# Patient Record
Sex: Female | Born: 1970 | Race: White | Hispanic: No | Marital: Married | Smoking: Never smoker
Health system: Southern US, Community
[De-identification: ages and names within clinical notes are randomized; demographics above are authoritative.]

## PROBLEM LIST (undated history)

## (undated) DIAGNOSIS — E079 Disorder of thyroid, unspecified: Secondary | ICD-10-CM

## (undated) DIAGNOSIS — F509 Eating disorder, unspecified: Secondary | ICD-10-CM

## (undated) DIAGNOSIS — F319 Bipolar disorder, unspecified: Secondary | ICD-10-CM

## (undated) HISTORY — PX: TYMPANOSTOMY TUBE PLACEMENT: SHX32

## (undated) HISTORY — PX: MULTIPLE TOOTH EXTRACTIONS: SHX2053

## (undated) HISTORY — PX: LEEP: SHX91

## (undated) HISTORY — DX: Bipolar disorder, unspecified: F31.9

## (undated) HISTORY — DX: Disorder of thyroid, unspecified: E07.9

## (undated) HISTORY — DX: Eating disorder, unspecified: F50.9

---

## 2000-08-20 ENCOUNTER — Other Ambulatory Visit: Admission: RE | Admit: 2000-08-20 | Discharge: 2000-08-20 | Payer: Self-pay | Admitting: Gynecology

## 2000-09-03 ENCOUNTER — Encounter: Admission: RE | Admit: 2000-09-03 | Discharge: 2000-09-03 | Payer: Self-pay | Admitting: Gastroenterology

## 2000-09-03 ENCOUNTER — Encounter: Payer: Self-pay | Admitting: Gastroenterology

## 2001-08-25 ENCOUNTER — Other Ambulatory Visit: Admission: RE | Admit: 2001-08-25 | Discharge: 2001-08-25 | Payer: Self-pay | Admitting: Internal Medicine

## 2002-07-10 ENCOUNTER — Encounter: Payer: Self-pay | Admitting: Family Medicine

## 2002-07-10 ENCOUNTER — Encounter: Admission: RE | Admit: 2002-07-10 | Discharge: 2002-07-10 | Payer: Self-pay | Admitting: Family Medicine

## 2003-04-21 ENCOUNTER — Other Ambulatory Visit: Admission: RE | Admit: 2003-04-21 | Discharge: 2003-04-21 | Payer: Self-pay | Admitting: Gynecology

## 2003-06-30 ENCOUNTER — Inpatient Hospital Stay (HOSPITAL_COMMUNITY): Admission: EM | Admit: 2003-06-30 | Discharge: 2003-07-05 | Payer: Self-pay | Admitting: Psychiatry

## 2005-01-09 ENCOUNTER — Other Ambulatory Visit: Admission: RE | Admit: 2005-01-09 | Discharge: 2005-01-09 | Payer: Self-pay | Admitting: Gynecology

## 2005-08-02 ENCOUNTER — Other Ambulatory Visit: Admission: RE | Admit: 2005-08-02 | Discharge: 2005-08-02 | Payer: Self-pay | Admitting: Gynecology

## 2005-08-09 ENCOUNTER — Ambulatory Visit (HOSPITAL_BASED_OUTPATIENT_CLINIC_OR_DEPARTMENT_OTHER): Admission: RE | Admit: 2005-08-09 | Discharge: 2005-08-09 | Payer: Self-pay | Admitting: Gynecology

## 2006-12-17 ENCOUNTER — Other Ambulatory Visit: Admission: RE | Admit: 2006-12-17 | Discharge: 2006-12-17 | Payer: Self-pay | Admitting: Gynecology

## 2007-04-18 ENCOUNTER — Ambulatory Visit: Payer: Self-pay | Admitting: Psychiatry

## 2007-04-18 ENCOUNTER — Inpatient Hospital Stay (HOSPITAL_COMMUNITY): Admission: AD | Admit: 2007-04-18 | Discharge: 2007-04-23 | Payer: Self-pay | Admitting: Psychiatry

## 2009-11-02 ENCOUNTER — Emergency Department (HOSPITAL_COMMUNITY): Admission: EM | Admit: 2009-11-02 | Discharge: 2009-11-02 | Payer: Self-pay | Admitting: Emergency Medicine

## 2011-04-10 NOTE — H&P (Signed)
[name], [name]                    ACCOUNT NO.:  [id number]   MEDICAL RECORD NO.:  [id number]          PATIENT TYPE:  IPS   LOCATION:  0602                          FACILITY:  [facility]   PHYSICIAN:  Anselm Jungling, MD  DATE OF BIRTH:  14-Oct-1971   DATE OF ADMISSION:  04/18/2007  DATE OF DISCHARGE:                       PSYCHIATRIC ADMISSION ASSESSMENT   IDENTIFYING INFORMATION:  This is a voluntary admission to the services  of Dr. Geralyn Flash.  This is a 40 year old divorced white female.  She reports that, over the past several months, she has been feeling  completely hopeless.  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  She states that she prays  to God every night to take her life.  The patient was referred by her  outpatient [facility] therapist, Gwen __________, due to weight loss and  sleep disturbance.  She has decreased her sleep to approximately four  hours at night and she has lost 10 pounds in the past two months.  She  states the meds are not working and her depression continues to  increase and she feels like she is crashing into a black hole.  She has  been on disability for her bipolar disorder for a number of years.  However, she is not making ends meet and has recently had to return to  work.   PAST PSYCHIATRIC HISTORY:  She was here with [place] at the [facility] June 30, 2003 to July 05, 2003.  She was initially hospitalized  at age 48 or 19 in 46, [place] for an eating disorder, anorexia, at  age 59 she was diagnosed as bipolar in [place] and she had two prior  hospitalizations in [place] again for the bipolar illness prior to her  admission here in 2004.   SOCIAL HISTORY:  She is divorced.  She has no children.  She has just  returned to work as a Scientist, physiological a few months ago and she does have a  boyfriend.   FAMILY HISTORY:  Her father and grandfather were bipolar.  Her paternal  grandmother had schizophrenia.   ALCOHOL/DRUG HISTORY:  She denies.   PRIMARY CARE PHYSICIAN:  Primary care Winnell Bento is Dr. Waynard Reeds.  She  sees Fabio Pierce, [address].D. for her psychiatric med management.   MEDICAL PROBLEMS:  None at this time.   CURRENT MEDICATIONS:  She is currently taking Geodon 80 mg at h.s.,  Prozac 80 mg at h.s., Topamax 100 mg at h.s. and trazodone 100 mg p.o.  at h.s.   ALLERGIES:  SULFA (she has an itch).   POSITIVE PHYSICAL FINDINGS:  She appears to be a well-developed, well-  nourished female who appears her stated age of 37.  She had no  remarkable physical findings.  Her vital signs show that she is 49  inches tall.  She weighs 91 pounds.  Her temperature is 98.1, her blood  pressure is 111/65, her pulse is 63 and her respirations are 20.   LABORATORY DATA:  Her labs are pending.  MENTAL STATUS EXAM:  Today, she is alert and oriented x3.  She is  appropriately groomed and dressed.  She does not appear to have an  unusual weight loss.  She is pleasant but is consistent with her height  and body frame.  Her mood and affect is pleasant but she is very sad.  Her facial expressions tend to be grim.  There was no evidence for  psychosis or thought disorder.  She denied suicidal ideation, plan or  intent.  Her intelligence is at least average.  Concentration and memory  are intact.  Judgment and insight are good.   DIAGNOSES:  AXIS I:  Bipolar, depressed.  History for eating disorder,  anorexia.  AXIS II:  Deferred.  AXIS III:  None at present.  AXIS IV:  Problems with primary support group and economic problems.  AXIS V:  30.   PLAN:  To increase the data and history.  To confer with Dr. Clare Gandy  regarding treatment strategy.  To explore for supports.  There will be a  family session scheduled with her mother and boyfriend.   ESTIMATED LENGTH OF STAY:  Three to five days.  Dr. Clare Gandy suggested  stopping the Topamax and starting Zyprexa.  She  states that the patient  tends to be very anxious and the patient has not had Zyprexa in the  past.  She stated she has had Seroquel with a bad response.  We will go  ahead and stop the Topamax and will start Zyprexa Zydis 10 mg at h.s.  tonight.      Mickie Leonarda Salon, P.A.-C.      Anselm Jungling, MD  Electronically Signed    MD/MEDQ  D:  04/19/2007  T:  04/19/2007  Job:  [id number]

## 2011-04-13 NOTE — H&P (Signed)
[name], [name]                              ACCOUNT NO.:  [id number]   MEDICAL RECORD NO.:  [id number]                   PATIENT TYPE:  IPS   LOCATION:  0507                                 FACILITY:  [facility]   PHYSICIAN:  Geoffery Lyons, M.D.                   DATE OF BIRTH:  1971/03/01   DATE OF ADMISSION:  06/30/2003  DATE OF DISCHARGE:  07/05/2003                         PSYCHIATRIC ADMISSION ASSESSMENT   IDENTIFYING INFORMATION:  This is a 40 year old married white female who is  a voluntary admission.   HISTORY OF PRESENT ILLNESS:  This patient, with a history of bipolar  disorder, had thoughts of cutting herself and started an attempt but did not  follow through on this on June 30, 2003.  She was frustrated and felt  rejected sexually by an acquaintance, who was not her husband.  The patient  and her husband are having sexual problems and the patient is quite  frustrated by their marital relationship, he being less interested or less  concerned about their sexual difficulties than she is.  She endorses having  suicidal thoughts yesterday.  It is unclear if she feels suicidal today.  She has had no clear homicidal thoughts or auditory or visual  hallucinations.  She was able to restrain herself yesterday.  Although she  went looking for a knife, she did not actually attempt to cut herself and  thought the better of it.   PAST PSYCHIATRIC HISTORY:  The patient is followed by Dr. Phyllis Ginger. [name] and  [name], who is her psychotherapist.  This is her [facility] admission to  [facility].  Her last hospitalization was in four  years ago in [place], [place] for suicidal thoughts.  The patient has said  that she has had suicidal ideation many times in the past, even through her  younger years with multiple hospitalizations.   SOCIAL HISTORY:  The patient has been married for seven days.  She has no  children.  No legal problems.  She lives at home with her  husband.  She is  not employed outside of the home.  The patient receives social security  disability income for her bipolar illness.   FAMILY HISTORY:  Father and an uncle with bipolar disorder.   ALCOHOL/DRUG HISTORY:  She denies.   MEDICAL HISTORY:  The patient is followed by Dr. Francene Finders, who is her  primary care physician.  Medical problems she denies.  Past medical history  is remarkable for removal of a cyst on her chin.  She has no history of  seizures, blackouts, memory loss.  No other surgeries or prior  hospitalizations.   MEDICATIONS:  The patient takes Tegretol 400 mg p.o. q.d., Wellbutrin 450 mg  p.o. q.d., Topamax 200 mg q.d., Minocycline 100 mg p.o. q.d., trazodone 150  mg p.o. q.h.s. and Klonopin 0.5 mg b.i.d.  She reports that she takes her  medications regularly.   ALLERGIES:  SULFA.   POSITIVE PHYSICAL FINDINGS:  This is a well-nourished, well-developed,  petite-built female who is generally healthy in appearance.  Vital signs on  admission were temperature 98.2, pulse 80, respirations 16, blood pressure  107/65.  She is 4 feet 11 inches tall and weighed 93 pounds.  She is fully  alert and cooperative.  HEAD:  Normocephalic and atraumatic.  EENT:  PERRLA.  Sclerae nonicteric.  No rhinorrhea.  Oropharynx noninjected.  NECK:  Supple.  CARDIOVASCULAR:  S1 and S2, no clicks, murmurs or gallops.  Apical pulse is  regular and synchronous with radial pulse.  LUNGS:  Clear to auscultation.  ABDOMEN:  Flat, soft, nontender.  GENITALIA:  Deferred.  MUSCULOSKELETAL:  No joint deformity noted.  Full range of motion.  Strength  5/5.  NEURO:  Cranial nerves 2-12 intact.  EOMs intact.  No nystagmus.  Motor  movements are smooth.  Sensory grossly intact.  Facial symmetry is present.  Grip strength equal bilaterally.  Deep tendon reflexes are 2+/5 and are  brisk.  Romberg without findings.  Toes are downgoing.  No focal findings.   LABORATORY DATA:  The patient's Tegretol  level is less than 2.0 in spite of  the fact that she says she takes her medication on a regular basis.  Other  labs reveal very mild decreased hemoglobin at 11.4, hematocrit 32.8,  platelets 219,000 and MCV normal at 95.  Potassium very slightly decreased  at 3.2.   MENTAL STATUS EXAM:  This is a fully alert female with a bright affect.  She  is well-composed, polite and cooperative.  Speech is normal.  Mood is  frustrated.  Mildly depressed but her thinking is logical and coherent.  Goal-oriented.  Some vague suicidal thoughts.  She has been at home with a  lot of time on her hands and has started playing with knives and feeling  very rejected, both by her husband and then finds herself rejected by  another man.  She is very concerned about not being sexually intimate with  her husband for the past six years.  Feels that he has rejected her and,  then when she attempts to go have a sexual liaison with another man, he also  rejects her.  No auditory or visual hallucinations.  No homicidal ideation.  Cognitively, she is intact and oriented x 3.   DIAGNOSES:   AXIS I:  Bipolar disorder not otherwise specified.   AXIS II:  Deferred/   AXIS III:  No diagnosis.   AXIS IV:  Moderate to severe (marital conflict).   AXIS V:  Current 40; past year 60.   PLAN:  Voluntarily admit the patient with 15-minute checks in place.  We  will recheck a Tegretol level on her and, at this point, continue her  current medications and see how she responds if this Tegretol level comes  back up to normal.  Otherwise, we will make plans from there.  Meanwhile,  address her mood stability and we will monitor her closely and we are  requesting a marital session as soon as possible.   ESTIMATED LENGTH OF STAY:  Five days.     Margaret A. Scott, N.P.                   Geoffery Lyons, M.D.    MAS/MEDQ  D:  08/17/2003  T:  08/18/2003  Job:  [id number]

## 2011-04-13 NOTE — Discharge Summary (Signed)
[name], [name]                              ACCOUNT NO.:  [id number]   MEDICAL RECORD NO.:  [id number]                   PATIENT TYPE:  IPS   LOCATION:  0507                                 FACILITY:  [facility]   PHYSICIAN:  Geoffery Lyons, M.D.                   DATE OF BIRTH:  11/21/1971   DATE OF ADMISSION:  06/30/2003  DATE OF DISCHARGE:  07/05/2003                                 DISCHARGE SUMMARY   CHIEF COMPLAINT AND PRESENT ILLNESS:  This was the first admission to [facility] for this 40 year old married white female voluntarily  admitted.  History of bipolar disorder, had thoughts of cutting herself.  Started an attempt but did not follow through.  Was frustrated and felt  rejected sexually by acquaintance, not the husband.  The patient and husband  have been having sexual problems.  The patient was frustrated with marital  relationship.  Endorsed suicidal ideation before she came in.   PAST PSYCHIATRIC HISTORY:  Actually seeing Mark P. Ilsa Iha, M.D. and Patty  ________ .  First time at [facility].   ALCOHOL/DRUG HISTORY:  Denies the use or abuse of any substances.   PAST MEDICAL HISTORY:  No history of any major medical condition.   MEDICATIONS:  Tegretol 400 mg daily, Wellbutrin 450 mg daily, Topamax 200 mg  daily, minocycline 100 mg every day, trazodone 150 mg at night, Klonopin 0.5  mg twice a day.   PHYSICAL EXAMINATION:  Performed and failed to show any acute findings.   MENTAL STATUS EXAM:  Fully alert and cooperative female.  Mood anxiety.  Affect broad.  Well-composed, polite, cooperative.  Speech normal rate,  rhythm and production.  Mood frustrated, mildly depressed.  Thoughts logical  and coherent.  Vague suicidal ideation.  Overwhelmed with what was going on.  Issues of self-esteem, self-image.  Cognition well-preserved.   ADMISSION DIAGNOSES:   AXIS I:  Bipolar disorder, depressed.   AXIS II:  No diagnosis.   AXIS III:  No  diagnosis.   AXIS IV:  Moderate,   AXIS V:  Global Assessment of Functioning upon admission 35; highest Global  Assessment of Functioning in the last year 68.   LABORATORY DATA:  CBC with hemoglobin 11.4.  Blood chemistry within normal  limits.  Thyroid profile within normal limits.   HOSPITAL COURSE:  She was admitted and started intensive individual and  group psychotherapy.  She was basically kept on her medications, Tegretol  400 mg daily, Wellbutrin XL 300 mg in the morning, Topamax 200 mg daily,  Prozac 40 mg daily, trazodone 150 mg at night, Klonopin 0.5 mg twice a day  and minocycline 100 mg daily.  Wellbutrin was increased to the 450 mg she  was taking before.  Did endorse that she was feeling guilty and ashamed  because she had the affair.  The person  broke off the relationship with her.  Felt very suicidal.  In the unit, she was able to open up and deal with the  grief and the loss.  Started sleeping better.  Found out that the husband  accepted a new job and they might have to move.  Became very upset.  Worked  on the relationship with the husband.  Started feeling better.  On July 05, 2003, she was in full contact with reality.  Mood improved.  Affect bright,  broad.  No suicidal ideation.  No homicidal ideation.  No delusions.  No  hallucinations.  Increased insight in terms of the stressors that led to the  admission.  Willing to pursue outpatient treatment and deal with these  events.   DISCHARGE DIAGNOSES:   AXIS I:  Bipolar disorder, depressed.   AXIS II:  No diagnosis.   AXIS III:  No diagnosis.   AXIS IV:  Moderate.   AXIS V:  Global Assessment of Functioning upon discharge 55-60.   DISCHARGE MEDICATIONS:  1. Minocycline 100 mg daily.  2. Wellbutrin XL 450 mg in the morning.  3. Tegretol 200 mg, 2 at bedtime.  4. Klonopin 0.5 mg at bedtime.  5. Topamax 100 mg twice a day.  6. Prozac 20 mg twice a day.  7. Trazodone 150 mg at bedtime.   FOLLOWUP:   Patti __________ and Phyllis Ginger. Ilsa Iha, M.D.                                               Geoffery Lyons, M.D.    IL/MEDQ  D:  07/28/2003  T:  07/29/2003  Job:  [id number]

## 2011-04-13 NOTE — Op Note (Signed)
NAMEMarkeia, [name]                    ACCOUNT NO.:  [id number]   MEDICAL RECORD NO.:  [id number]          PATIENT TYPE:  AMB   LOCATION:  NESC                         FACILITY:  [facility]   PHYSICIAN:  Juan H. Lily Peer, M.D.DATE OF BIRTH:  15-Feb-1971   DATE OF PROCEDURE:  08/09/2005  DATE OF DISCHARGE:                                 OPERATIVE REPORT   SURGEON:  Juan H. 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   PREOPERATIVE DIAGNOSES:  1.  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  3.  Negative ECC.   POSTOPERATIVE DIAGNOSES:  1.  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  3.  Negative ECC.   ANESTHESIA:  General endotracheal anesthesia.   FINDINGS:  Extensive cobblestoning effect from the 9 to the 3 o'clock  position and leukoplakic lesions from the 3 to 9 o'clock position.  The  ectocervix transformation zone was visualized.   DESCRIPTION OF OPERATION:  After the patient was adequately counseled, she  was taken to the operating room where she underwent general tracheal  anesthesia.  She was placed in high lithotomy position.  The vaginal and  perineal areas were draped and the surrounding area with moist laps.  A  titanium coated speculum was inserted into the vaginal vault with a plume  extractor attached to it.  Acetic acid was applied to the cervix and the  vagina, reinspection of the entire vagina and cervix.  The cervical findings  as described above.  With a CO2 laser brought into view and set at 8 watts  on a continuous mode, the area was delineated in a circumferential fashion,  alignment at least 2 mm away from the lesion and then in a brush-like  technique, the ectocervix was ablated including the transformation  zone in  the entire ectocervix for a depth of approximately 3-4 mm.  Monsel solution  was used for hemostasis, and then Amino-Cerv cream was applied  intravaginally which the patient will continue to apply at home q.h.s. for  the next 2 weeks.  She was extubated, transferred to recovery room with  stable vital signs.  Blood loss was minimal.  Fluid resuscitation consisted  of 1000 mL of lactated Ringer's.      Juan H. Lily Peer, M.D.  Electronically Signed    JHF/MEDQ  D:  08/09/2005  T:  08/09/2005  Job:  [id number]

## 2011-04-13 NOTE — Discharge Summary (Signed)
[name], Sanchez                    ACCOUNT NO.:  [id number]   MEDICAL RECORD NO.:  [id number]          PATIENT TYPE:  IPS   LOCATION:  0602                          FACILITY:  [facility]   PHYSICIAN:  Anselm Jungling, MD  DATE OF BIRTH:  September 05, 1971   DATE OF ADMISSION:  04/18/2007  DATE OF DISCHARGE:  04/23/2007                               DISCHARGE SUMMARY   IDENTIFYING DATA AND REASON FOR ADMISSION:  The patient is a 40 year old  single female, living with her boyfriend, working in [place], on  disability for bipolar disorder.  She is a patient of Fabio Pierce  and Dr. Beckey Downing.  She was admitted due to increasing depression and  suicidal ideation.  Stressors consisted of her having financial  difficulties, being the sole financial [name] in her household.  She  had been admitted to our facility 4 years prior after an episode of self-  inflicted cutting.  She denied any other history of suicide attempt.   She had recently been having weight loss, insomnia, and increasing  hopelessness.  Her current medications included Geodon, trazodone,  Topamax, and Prozac.  She denied any drug or alcohol issues.  She stated  that she had had a prior history of mania, but not for years.  Please  refer to the admission note for further details pertaining to the  symptoms, circumstances and history that led to her hospitalization.  She was given initial Axis I diagnosis of bipolar disorder by history,  currently depressed without psychotic features.   MEDICAL AND LABORATORY:  The patient was medically and physically  assessed by the psychiatric nurse practitioner.  She was in good health  without any active or chronic medical problems.   HOSPITAL COURSE:  The patient was admitted to the adult inpatient  psychiatric service.  She presented as a slender female, alert, fully  oriented, and pleasant but very sad and grim.  She was a good historian.  There were no signs or symptoms of psychosis  or thought disorder.  She  denied any suicidal plan or intention.  She verbalized a strong desire  for help.   She was involved in the therapeutic milieu, and treated with a  psychotropic regimen consisting of Prozac, trazodone, Topamax, and a  trial of Abilify, instead of Geodon which had not appeared to be  effective for her.   On the second hospital day there was a family session involving the  patient and her mother, who had come from [place] for the weekend.  Main  concerns addressed discharge planning, applying structure to the  patient's work, social supports, and therapeutic supports through her  therapist.   The patient's mother was quite supportive.  The patient reported in that  meeting that she was still feeling hopeless and had some occasional  thoughts of suicide, but contracted for safety on the inpatient unit.  The patient and her brother were given information on suicide prevention  as well as the suicide prevention pamphlet and the crisis hotline  numbers card.   The patient's hospital stay continued for another  4 days following this.  There were brief trials of the initial Geodon, and later Zyprexa, which  were not tolerated well, with Zyprexa causing excessive sedation.  They  were both discontinued in favor of a trial of Abilify which was much  better tolerated.   By the sixth hospital day, the patient's mood was significantly  improved, she looked much better.  She was well rested, relaxed, with  better mood, less depression and anxiety.  She was tolerating Abilify  well and sleeping well as well.  She agreed to the following aftercare  plan.   AFTERCARE:  The patient was to follow up with Dr. Clare Gandy at [facility] with an appointment on May 05, 2007.  She was also to see  Dr. Beckey Downing on Apr 25, 2007.   DISCHARGE MEDICATIONS:  1. Prozac 80 mg daily.  2. Trazodone 100 mg nightly.  3. Abilify 10 mg nightly.  4. Topamax 100 mg nightly.   DISCHARGE  DIAGNOSES:  AXIS I:  Bipolar disorder not otherwise specified,  most recently depressed.  AXIS II:  Deferred.  AXIS III:  No acute or chronic illnesses.  AXIS IV:  Stressors severe.  AXIS V:  Global assessment of functioning on discharge 65.      Anselm Jungling, MD  Electronically Signed     SPB/MEDQ  D:  04/28/2007  T:  04/28/2007  Job:  [id number]

## 2011-04-13 NOTE — H&P (Signed)
[name], [name]                    ACCOUNT NO.:  [id number]   MEDICAL RECORD NO.:  [id number]           PATIENT TYPE:   LOCATION:                                 FACILITY:   PHYSICIAN:  Juan H. Lily Peer, M.D.     DATE OF BIRTH:   DATE OF ADMISSION:  08/09/2005  DATE OF DISCHARGE:                                HISTORY & PHYSICAL   DATE OF SCHEDULED SURGERY:  August 09, 2005 at 1 p.m. at [facility].   CHIEF COMPLAINT:  Persistent cervical dysplasia.   HISTORY:  The patient is a 40 year old gravida 2 para 0 AB 2 who was seen in  February 2006 for her annual gynecological examination. She had been  scheduled earlier this year for CO2 laser ablation of CIN-1 of the  ectocervix for cervical biopsy-proven dysplastic lesions noted at 10 o'clock  and 12 o'clock of the ectocervix. She also had benign endocervical  curettings with fragments of detached dysplastic squamous mucosa. The  patient had gotten pregnant since that time and had two terminations. Her  last termination was approximately a month-and-a-half ago. Her Pap smear was  repeated today, along with an HPV hybrid capture. The Pap smear had  demonstrated low-grade CIN-1/VIN-1 with HPV changes, and verbal report from  the pathologist that high-risk HPV was detected. The patient was then seen  in the office on September 7 as well where she had repeated colposcopy to  see if anything had changed from her previous colposcopic evaluation earlier  in the year, and on four different regions of the ectocervix, now had CIN-1  at the 3, 6, 9, and 12 o'clock position, and ECC was benign. The patient is  scheduled to undergo CO2 laser ablation of CIN-1 this Thursday, September  14.   PAST MEDICAL HISTORY:  Two pregnancies with two elective ABs. She has been  under the care of Dr. Ilsa Iha, her psychiatrist. She is on the following  medications:  1.  Tegretol.  2.  Prozac.  3.  Topamax.  4.  Trazodone.  5.   Wellbutrin.   She has history of bipolar disorder, history of anorexia and laxative abuse.  Family history of hypertension in her grandfather, and grandfather with  prostate cancer. She is allergic to SULFA.   PHYSICAL EXAMINATION:  VITAL SIGNS:  The patient is 4 feet 11.5 inches tall.  Blood pressure 100/70, weight 95 pounds.  HEENT:  Unremarkable.  NECK:  Supple, trachea midline. No carotid bruits, no thyromegaly.  LUNGS:  Clear to auscultation without rhonchi or wheezes.  HEART:  Regular rate and rhythm, no murmurs or gallops.  BREAST:  Exam not done.  ABDOMEN:  Soft, nontender, without rebound or guarding.  PELVIC:  Bartholin, urethral, Skene glands within normal limits. Vagina and  cervix as described above on colposcopic evaluation. Bimanual examination:  Anteverted uterus; normal size, shape, and consistency. Adnexa without mass  or tenderness. Rectal exam not done.   ASSESSMENT:  A 40 year old gravidaida 2 para 0 abortus 2 with persistent  cervical intraepithelial neoplasia grade 1 in  scattered areas of the  ectocervix at the 3, 6, 9, and 12 o'clock position. Endocervical curettage  was benign. 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 The patient scheduled to undergo a CO2 laser  ablation of the cervix tomorrow, Thursday, August 09, 2005 at 1 p.m.  Risks, benefits, and pros and cons were discussed with the patient and  recommendation for close surveillance after the treatment for the next  several years was recommended. All questions were answered and will follow  accordingly.   PLAN:  As per assessment above.      Juan H. Lily Peer, M.D.  Electronically Signed     JHF/MEDQ  D:  08/08/2005  T:  08/08/2005  Job:  [id number]

## 2011-09-07 ENCOUNTER — Other Ambulatory Visit (HOSPITAL_COMMUNITY)
Admission: RE | Admit: 2011-09-07 | Discharge: 2011-09-07 | Disposition: A | Discharge status: Home / Self Care | Payer: 59 | Source: Ambulatory Visit | Attending: Family Medicine | Admitting: Family Medicine

## 2011-09-07 ENCOUNTER — Other Ambulatory Visit: Payer: Self-pay | Admitting: Physician Assistant

## 2011-09-07 DIAGNOSIS — Z0000 Encounter for general adult medical examination without abnormal findings: Secondary | ICD-10-CM | POA: Insufficient documentation

## 2011-09-07 DIAGNOSIS — Z113 Encounter for screening for infections with a predominantly sexual mode of transmission: Secondary | ICD-10-CM | POA: Insufficient documentation

## 2012-09-08 ENCOUNTER — Other Ambulatory Visit: Payer: Self-pay | Admitting: Physician Assistant

## 2012-09-08 ENCOUNTER — Other Ambulatory Visit (HOSPITAL_COMMUNITY)
Admission: RE | Admit: 2012-09-08 | Discharge: 2012-09-08 | Disposition: A | Discharge status: Home / Self Care | Payer: Medicare PPO | Source: Ambulatory Visit | Attending: Family Medicine | Admitting: Family Medicine

## 2012-09-08 DIAGNOSIS — Z0000 Encounter for general adult medical examination without abnormal findings: Secondary | ICD-10-CM | POA: Insufficient documentation

## 2013-09-09 ENCOUNTER — Other Ambulatory Visit: Payer: Self-pay | Admitting: Family Medicine

## 2013-09-09 ENCOUNTER — Other Ambulatory Visit (HOSPITAL_COMMUNITY)
Admission: RE | Admit: 2013-09-09 | Discharge: 2013-09-09 | Disposition: A | Discharge status: Home / Self Care | Payer: Medicare Other | Source: Ambulatory Visit | Attending: Family Medicine | Admitting: Family Medicine

## 2013-09-09 DIAGNOSIS — Z113 Encounter for screening for infections with a predominantly sexual mode of transmission: Secondary | ICD-10-CM | POA: Insufficient documentation

## 2013-09-09 DIAGNOSIS — Z01419 Encounter for gynecological examination (general) (routine) without abnormal findings: Secondary | ICD-10-CM | POA: Insufficient documentation

## 2013-10-19 ENCOUNTER — Ambulatory Visit (HOSPITAL_COMMUNITY): Payer: 59 | Admitting: Psychiatry

## 2013-10-28 ENCOUNTER — Encounter (INDEPENDENT_AMBULATORY_CARE_PROVIDER_SITE_OTHER): Payer: Self-pay

## 2013-10-28 ENCOUNTER — Ambulatory Visit (INDEPENDENT_AMBULATORY_CARE_PROVIDER_SITE_OTHER): Payer: No Typology Code available for payment source | Admitting: Psychiatry

## 2013-10-28 VITALS — BP 83/61 | HR 69 | Ht 61.0 in | Wt 94.6 lb

## 2013-10-28 DIAGNOSIS — F3130 Bipolar disorder, current episode depressed, mild or moderate severity, unspecified: Secondary | ICD-10-CM

## 2013-10-28 DIAGNOSIS — F509 Eating disorder, unspecified: Secondary | ICD-10-CM
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
## 2013-11-17 ENCOUNTER — Ambulatory Visit (INDEPENDENT_AMBULATORY_CARE_PROVIDER_SITE_OTHER): Payer: No Typology Code available for payment source | Admitting: Psychiatry

## 2013-11-17 DIAGNOSIS — F5000 Anorexia nervosa, unspecified: Secondary | ICD-10-CM

## 2013-11-17 DIAGNOSIS — F39 Unspecified mood [affective] disorder: Secondary | ICD-10-CM

## 2013-11-17 DIAGNOSIS — F3130 Bipolar disorder, current episode depressed, mild or moderate severity, unspecified: Secondary | ICD-10-CM

## 2013-11-18 ENCOUNTER — Encounter (HOSPITAL_COMMUNITY): Payer: Self-pay | Admitting: Psychiatry

## 2013-11-18 DIAGNOSIS — F5000 Anorexia nervosa, unspecified: Secondary | ICD-10-CM | POA: Insufficient documentation

## 2013-11-18 NOTE — Progress Notes (Signed)
Patient ID: [name], female   DOB: [birth year], 42 y.o.   MRN: [id number] Presenting Problem Chief Complaint: eating disorder, mood disorder  What are the main stressors in your life right now, how long? Food restriction, anger triggered by husband's family  Previous mental health services Have you ever been treated for a mental health problem, when, where, by whom? Yes. Pt. Reports 6-7 hospitalizations related to bipolar disorder diagnosis over the past 20 years and 1 hospitalization for eating disorder 20+ years ago.    Are you currently seeing a therapist or counselor, counselor's name? No   Have you ever had a mental health hospitalization, how many times, length of stay? Yes   Have you ever been treated with medication, name, reason, response? Yes   Have you ever had suicidal thoughts or attempted suicide, when, how? None reported  Risk factors for Suicide Demographic factors:  Caucasian Current mental status: none reported Loss factors: none reported Historical factors: none reported Risk Reduction factors: Living with another person, especially a relative and Positive social support Clinical factors:  Anorexia Nervosa, Bipolar Disorder Cognitive features that contribute to risk: Polarized thinking    SUICIDE RISK:  Minimal: No identifiable suicidal ideation.  Patients presenting with no risk factors but with morbid ruminations; may be classified as minimal risk based on the severity of the depressive symptoms    Social/family history Have you been married, how many times?  twice  Do you have children?  no  Who lives in your current household? Lives with husband of one year.  Military history: No   Religious/spiritual involvement:  What religion/faith base are you? Christian  Family of origin (childhood history)  Where were you born? [place] Where did you grow up[place] Describe the atmosphere of the household where you grew up: loving,  supportive Do you have siblings, step/half siblings, list names, relation, sex, age? yes  Are your parents separated/divorced, when and why? deferred  Are your parents alive? yes  Social supports (personal and professional): husband, family in [place]  Education How many grades have you completed? deferred Did you have any problems in school, what type? deferred Medications prescribed for these problems? deferred  Employment (financial issues) unemployed  Legal history none  Trauma/Abuse history: Have you ever been exposed to any form of abuse, what type? None reported  Have you ever been exposed to something traumatic, describe? None reported  Substance use None reported  Mental Status: General Appearance [name]:  Casual Eye Contact:  Good Motor Behavior:  Restlestness Speech:  Normal Level of Consciousness: alert and lethargic Mood:  Depressed Affect:  Depressed Anxiety Level:  moderate Thought Process:  Coherent Thought Content:  Obsessions- Pt. Reports active eating active eating disorder, self-describes "obsession" with food restricting. Perception: body dysmorphia Judgment:  Fair Insight:  Absent Cognition:  wnl  Diagnosis AXIS I Anorexia Nervosa, restricting type; Mood Disorder NOS  AXIS II Deferred  AXIS III No past medical history on file.  AXIS IV other psychosocial or environmental problems  AXIS V 41-50 serious symptoms   Plan: Pt. Reports food restriction,persistnet behavior that interferes with weight gain, and disturbance in the way in which her body shape is experienced. Pt. Reports that husband and friends have recognized sallowness, weakness, and lethargy. Pt. Reports that her current food intake consists of 2 protein shakes, a V8, and a few sandwich cookies. Pt. 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 Pt. Remarried  one year ago and ED symptoms triggered by relationships with current husband's family. Pt. Was encouraged to keep a food diary and attempt to slowly increase and modify food intake, ex. Replace 4 cookies with 1/2 [place] sandwich. Explained to Pt. That her eating disorder should be considered primary diagnosis and would best be treated with referral to ED specialist and nutritionist. Pt. To return in 2 weeks for further assessment and referral.  _________________________________________ Boneta Lucks, Ph.D., LPC, NCC

## 2013-12-04 ENCOUNTER — Ambulatory Visit (INDEPENDENT_AMBULATORY_CARE_PROVIDER_SITE_OTHER): Payer: No Typology Code available for payment source | Admitting: Psychiatry

## 2013-12-04 DIAGNOSIS — F3130 Bipolar disorder, current episode depressed, mild or moderate severity, unspecified: Secondary | ICD-10-CM

## 2013-12-04 DIAGNOSIS — F319 Bipolar disorder, unspecified: Secondary | ICD-10-CM

## 2013-12-04 DIAGNOSIS — F509 Eating disorder, unspecified: Secondary | ICD-10-CM

## 2013-12-04 MED ORDER — ZIPRASIDONE HCL 20 MG PO CAPS: 20.0000 mg | ORAL_CAPSULE | Freq: Two times a day (BID) | ORAL | Status: DC

## 2013-12-04 MED ORDER — FLUOXETINE HCL 20 MG PO CAPS
60.0000 mg | ORAL_CAPSULE | Freq: Every day | ORAL | Status: DC
Start: 2013-12-04 — End: 2014-03-24

## 2013-12-04 MED ORDER — TOPIRAMATE 100 MG PO TABS: 100.0000 mg | ORAL_TABLET | Freq: Every day | ORAL | Status: DC

## 2013-12-04 MED ORDER — BUPROPION HCL ER (XL) 150 MG PO TB24: 150.0000 mg | ORAL_TABLET | ORAL | Status: DC

## 2013-12-04 MED ORDER — TRAZODONE HCL 100 MG PO TABS: 100.0000 mg | ORAL_TABLET | Freq: Every day | ORAL | Status: DC

## 2013-12-04 NOTE — Progress Notes (Signed)
[id number]  Annette Jeffie PollockKelly Sanchez [id number] 43 y.o.  12/04/2013 11:00 AM  Chief Complaint: "manic and obsessive symptoms about eating"  History of Present Illness: Patient is a 43 year old Caucasian woman with a history of bipolar disorder and eating disorder for here for a followup appointment. She reports doing better mood wise since her last appointment. She reports having increased the Prozac to 60 mg from 40 mg. Though of her previous note reflects that she was taking 60 mg of Prozac and was increased to 80 mg, patient today reports she was only taking 40 and had increased it to 60. She states her mood has been better. She is having some eating problems. States she recently moved into her fianc's house and it's causing her some issues. She has always had an eating disorder but states she has been able to bring it under control.  Has been seeing Annette DikeJennifer for therapy and the feels it has been highly beneficial. Sleeping okay and appetite is low. sHe is endorsing some manic symptoms but states they're all related to her eating habits. Feels high when she does not eat.  Suicidal Ideation: No Plan Formed: No Patient has means to carry out plan: No  Homicidal Ideation: No Plan Formed: No Patient has means to carry out plan: No  Review of Systems: Psychiatric: Agitation: Yes Hallucination: No Depressed Mood: No Insomnia: No Hypersomnia: No Altered Concentration: No Feels Worthless: No Grandiose Ideas: No Belief In Special Powers: No New/Increased Substance Abuse: No Compulsions: No  Neurologic: Headache: No Seizure: No Paresthesias: No  Past Medical Family, Social History: Denies any medical illnesses. GM had depression, Father has Bipolar disorder.  No outpatient encounter prescriptions on file as of 12/04/2013.    Past Psychiatric History/Hospitalization(s): Anxiety: Yes Bipolar Disorder: Yes Depression: No Mania: No Psychosis:  No Schizophrenia: No Personality Disorder: No Hospitalization for psychiatric illness: Yes History of Electroconvulsive Shock Therapy: No Prior Suicide Attempts: No  Physical Exam: Constitutional:  Weight: 89.0 lbs, BP- 110/75 mm hg  General Appearance: alert, oriented, no acute distress  Musculoskeletal: Strength & Muscle Tone: within normal limits Gait & Station: normal Patient leans: N/A  Psychiatric: Speech (describe rate, volume, coherence, spontaneity, and abnormalities if any): normal rate  Thought Process (describe rate, content, abstract reasoning, and computation): normal  Associations: Coherent  Thoughts: normal  Mental Status: Orientation: oriented to person, place, time/date and situation Mood & Affect: normal affect Attention Span & Concentration: fair  Medical Decision Making (Choose Three): Established Problem, Stable/Improving (1), Review of Psycho-Social Stressors (1) and Review of Medication Regimen & Side Effects (2)  Assessment: Axis I: Bipolar disorder, Eating disorder nos  Axis II: deferred  Axis III: none  Axis IV: eating disorder  Axis V: GAF of 70   Plan: Discussed with patient that the combination of Prozac, Wellbutrin and trazodone can cause side effects and also reduce her appetite. Patient reports that she does not take the Wellbutrin she stays in bed all day and she needs this medication combination for her mood. We'll continue Prozac at 60 mg daily, Wellbutrin XL at 150 mg daily, Geodon 40 mg daily and trazodone at 100 mg daily. Patient also takes Topamax 100 mg daily. It is concerning that patient insists on taking a combination of medications that can cause  decreased appetite such as with Wellbutrin, Prozac and Topamax. We'll continue to monitor patient and if she continues to eat poorly and have issues with eating will taper patient off either Wellbutrin  or Topamax. Will discuss the eating disorder issues with her therapist  [name] as well.  Patrick North, MD 12/04/2013

## 2013-12-14 ENCOUNTER — Encounter (INDEPENDENT_AMBULATORY_CARE_PROVIDER_SITE_OTHER): Payer: Self-pay

## 2013-12-14 ENCOUNTER — Ambulatory Visit (INDEPENDENT_AMBULATORY_CARE_PROVIDER_SITE_OTHER): Payer: No Typology Code available for payment source | Admitting: Psychiatry

## 2013-12-14 DIAGNOSIS — F5000 Anorexia nervosa, unspecified: Secondary | ICD-10-CM

## 2013-12-14 DIAGNOSIS — F3130 Bipolar disorder, current episode depressed, mild or moderate severity, unspecified: Secondary | ICD-10-CM

## 2013-12-14 NOTE — Progress Notes (Signed)
   THERAPIST PROGRESS NOTE  Session Time: 11:00-11:50  Participation Level: Active  Behavioral Response: CasualAlertAnxious and Depressed  Type of Therapy: Individual Therapy  Treatment Goals addressed: emotion regulation, eating disorder, body dysmorphia, self-esteem  Interventions: CBT  Summary: [name] is a 43 y.o. female who presents with depression.   Suicidal/Homicidal: Nowithout intent/plan  Therapist Response: Pt. Reports that she are normally on Christmas day, but following the holiday returned to restricting food relying primarily on 2 protein shakes, 3 cookies, and few ounces of protein and vegetables for dinned. Session focused on feelings of abandonment by father who suffered brom bipolar disorder and schizophrenia and mother who was highly critical in which she never felt good enough. Explored pattern of pleasing behavior in relationships/"friends with everybody". Pt. Requested that we discuss sexual inhibition with current husband, negative feelings toward body and body dysmorphia in next session.  Plan: Made referral to [name], RD. Return again in 2 weeks.  Diagnosis: Axis I: Depressive Disorder NOS    Axis II: No diagnosis    Wynonia MustyBrown, Jennifer B, COUNS 12/14/2013

## 2014-01-04 ENCOUNTER — Ambulatory Visit (INDEPENDENT_AMBULATORY_CARE_PROVIDER_SITE_OTHER): Payer: No Typology Code available for payment source | Admitting: Psychiatry

## 2014-01-04 DIAGNOSIS — F5000 Anorexia nervosa, unspecified: Secondary | ICD-10-CM

## 2014-01-04 DIAGNOSIS — F3289 Other specified depressive episodes: Secondary | ICD-10-CM

## 2014-01-04 DIAGNOSIS — F329 Major depressive disorder, single episode, unspecified: Secondary | ICD-10-CM

## 2014-01-04 DIAGNOSIS — F3130 Bipolar disorder, current episode depressed, mild or moderate severity, unspecified: Secondary | ICD-10-CM

## 2014-01-04 NOTE — Progress Notes (Signed)
Patient ID: [name], female   DOB: 07-07-1971, 43 y.o.   MRN: [id number]  Session Time: 3:00-3:50  Participation Level: Active   Behavioral Response: CasualAlertAnxious and Depressed   Type of Therapy: Individual Therapy   Treatment Goals addressed: emotion regulation, eating disorder, body dysmorphia, self-esteem   Interventions: CBT   Summary: [name] is a 43 y.o. female who presents with depression.   Suicidal/Homicidal: Nowithout intent/plan   Therapist Response: Pt. Was joined in session with husband (Nate). Pt. Stated concerns related to relationship, loss of interest in sex. Pt. Reported primary concern for today's session was wanting to working on redeveloping sexual interest with her husband. Pt.'s eating disorder is active. Pt. Continues to lose weight, stated that she "hates her woman parts", continues to see a fat person in the mirror despite being underweight, actively engaged in food restriction with meal replacement shakes. Pt. Also reports that she feels heart beating fast. Pt. Did not follow up on referral for further assessment of eating behavior. Explained to Pt. That work on sexual intimacy is premature until she commits to intensive therapy regarding eating disorder and body dysmorphia. Provided referrals to Elio ForgetJulie Dillon and Wyona AlmasJeannie Sykes.   Plan: Made referral to Wyona AlmasJeannie Sykes, RD. Return again in 2 weeks.   Diagnosis: Axis I: Depressive Disorder NOS  Axis II: No diagnosis  Wynonia MustyBrown, Trude Cansler B, COUNS  01/04/2014

## 2014-02-05 ENCOUNTER — Ambulatory Visit (HOSPITAL_COMMUNITY): Payer: Self-pay | Admitting: Psychiatry

## 2014-02-24 ENCOUNTER — Ambulatory Visit (HOSPITAL_COMMUNITY): Payer: Self-pay | Admitting: Psychiatry

## 2014-03-01 ENCOUNTER — Ambulatory Visit: Payer: Self-pay | Admitting: Family Medicine

## 2014-03-15 ENCOUNTER — Ambulatory Visit: Payer: Self-pay | Admitting: Family Medicine

## 2014-03-23 ENCOUNTER — Ambulatory Visit (INDEPENDENT_AMBULATORY_CARE_PROVIDER_SITE_OTHER): Payer: No Typology Code available for payment source | Admitting: Psychiatry

## 2014-03-23 DIAGNOSIS — F5000 Anorexia nervosa, unspecified: Secondary | ICD-10-CM

## 2014-03-23 DIAGNOSIS — F3130 Bipolar disorder, current episode depressed, mild or moderate severity, unspecified: Secondary | ICD-10-CM

## 2014-03-24 ENCOUNTER — Ambulatory Visit (INDEPENDENT_AMBULATORY_CARE_PROVIDER_SITE_OTHER): Payer: No Typology Code available for payment source | Admitting: Psychiatry

## 2014-03-24 VITALS — BP 93/57 | HR 68 | Ht 60.5 in | Wt 89.8 lb

## 2014-03-24 DIAGNOSIS — F3162 Bipolar disorder, current episode mixed, moderate: Secondary | ICD-10-CM

## 2014-03-24 DIAGNOSIS — F319 Bipolar disorder, unspecified: Secondary | ICD-10-CM

## 2014-03-24 DIAGNOSIS — F509 Eating disorder, unspecified: Secondary | ICD-10-CM

## 2014-03-24 MED ORDER — FLUOXETINE HCL 20 MG PO CAPS: 60.0000 mg | ORAL_CAPSULE | Freq: Every day | ORAL | Status: DC

## 2014-03-24 MED ORDER — BUPROPION HCL ER (XL) 150 MG PO TB24: 150.0000 mg | ORAL_TABLET | ORAL | Status: DC

## 2014-03-24 MED ORDER — TOPIRAMATE 100 MG PO TABS: 100.0000 mg | ORAL_TABLET | Freq: Every day | ORAL | Status: DC

## 2014-03-24 MED ORDER — ZIPRASIDONE HCL 20 MG PO CAPS: 20.0000 mg | ORAL_CAPSULE | Freq: Two times a day (BID) | ORAL | Status: DC

## 2014-03-24 MED ORDER — TRAZODONE HCL 100 MG PO TABS: 100.0000 mg | ORAL_TABLET | Freq: Every day | ORAL | Status: DC

## 2014-03-24 MED ORDER — HYDROXYZINE PAMOATE 25 MG PO CAPS: 25.0000 mg | ORAL_CAPSULE | Freq: Three times a day (TID) | ORAL | Status: DC | PRN

## 2014-03-24 NOTE — Progress Notes (Signed)
   THERAPIST PROGRESS NOTE   Session Time: 2:00-2:50   Participation Level: Active   Behavioral Response: CasualAlertAnxious and Depressed   Type of Therapy: Individual Therapy   Treatment Goals addressed: emotion regulation, eating disorder, body dysmorphia, self-esteem   Interventions: CBT   Summary: Sharman Jeffie PollockKelly Hirt is a 43 y.o. female who presents with depression and eating disorder.   Suicidal/Homicidal: Nowithout intent/plan  Therapist Response: Pt. Presents with bright affect. Pt. Reports that she has gained 5 pounds since our last meeting. Pt. Attributes weight gain to the addition of cupcakes to her diet. Pt. Reports that she is no longer using protein shakes and focuses on eating whole foods. Pt. Reports that she attempted to follow up with nutritionist but that her insurance would not cover. Pt. Reports that parents visited and were alarmed by her weight loss which has been significant motivator to eat more food. Pt. Discussed feelings related to loss of control in her marriage, feeling emotionally dependent on her husband and attempt to find control with food restriction. Pt. Also reports constipation which she states has been a problem since she was a small child, but that she is reluctant to discuss with primary care provider because of embarrassment. Pt. Reports that sexual intimacy continues to be a problem. Session focused on developing positive feelings toward physical body, developing eating as a sensory experience and connection to physical hunger.    Plan:  Pt. Encouraged to discuss constipation with PCP. Pt. To continue with CBT based therapy. Return again in 2 weeks.   Diagnosis: Axis I: Depressive Disorder NOS   Axis II: No diagnosis    Wynonia MustyBrown, Starsky Nanna B, COUNS 03/24/2014

## 2014-03-24 NOTE — Progress Notes (Signed)
Patient ID: [name], female   DOB: [birth year], 43 y.o.   MRN: [id number]  [facility] [id number] Progress Note  Charm Jeffie PollockKelly Luckman [id number] 43 y.o.  03/24/2014 10:42 AM  Chief Complaint: "mood issues"  History of Present Illness: Patient is a 43 year old Caucasian woman with a history of bipolar disorder and eating disorder for here for a followup appointment. She reports doing better mood wise since her last appointment.  She states her mood has been better. She reports eating better and has gained 5 lbs in 3 months.She has always had an eating disorder but states she has been able to bring it under control. Endorsing panic attacks about once a month, states being very anxious about this and not having anything to help her. Was taking Klonopin in the past. Has been seeing Victorino DikeJennifer for therapy and the feels it has been highly beneficial.  Sleeping okay and appetite is low.    Suicidal Ideation: No Plan Formed: No Patient has means to carry out plan: No  Homicidal Ideation: No Plan Formed: No Patient has means to carry out plan: No  Review of Systems: Psychiatric: Agitation: Yes Hallucination: No Depressed Mood: No Insomnia: No Hypersomnia: No Altered Concentration: No Feels Worthless: No Grandiose Ideas: No Belief In Special Powers: No New/Increased Substance Abuse: No Compulsions: No  Neurologic: Headache: No Seizure: No Paresthesias: No  Past Medical Family, Social History: Denies any medical illnesses. GM had depression, Father has Bipolar disorder.  Outpatient Encounter Prescriptions as of 03/24/2014  Medication Sig  . buPROPion (WELLBUTRIN XL) 150 MG 24 hr tablet Take 1 tablet (150 mg total) by mouth every morning.  Marland Kitchen. FLUoxetine (PROZAC) 20 MG capsule Take 3 capsules (60 mg total) by mouth daily.  Marland Kitchen. topiramate (TOPAMAX) 100 MG tablet Take 1 tablet (100 mg total) by mouth daily.  . traZODone (DESYREL) 100 MG tablet Take 1 tablet (100 mg total) by mouth at  bedtime.  . ziprasidone (GEODON) 20 MG capsule Take 1 capsule (20 mg total) by mouth 2 (two) times daily with a meal.    Past Psychiatric History/Hospitalization(s): Anxiety: Yes Bipolar Disorder: Yes Depression: No Mania: No Psychosis: No Schizophrenia: No Personality Disorder: No Hospitalization for psychiatric illness: Yes History of Electroconvulsive Shock Therapy: No Prior Suicide Attempts: No  Physical Exam: Constitutional:  Weight: 89.0 lbs, BP- 110/75 mm hg  General Appearance: alert, oriented, no acute distress  Musculoskeletal: Strength & Muscle Tone: within normal limits Gait & Station: normal Patient leans: N/A  Psychiatric: Speech (describe rate, volume, coherence, spontaneity, and abnormalities if any): normal rate  Thought Process (describe rate, content, abstract reasoning, and computation): normal  Associations: Coherent  Thoughts: normal  Mental Status: Orientation: oriented to person, place, time/date and situation Mood & Affect: normal affect Attention Span & Concentration: fair  Medical Decision Making (Choose Three): Established Problem, Stable/Improving (1), Review of Psycho-Social Stressors (1) and Review of Medication Regimen & Side Effects (2)  Assessment: Axis I: Bipolar disorder, Eating disorder nos  Axis II: deferred  Axis III: none  Axis IV: eating disorder  Axis V: GAF of 70   Plan: Continue current medications at current dosages. Start Vistaril at 25mg  po bid prn for anxiety. Continue therapy with Victorino DikeJennifer. Continue to monitor weight closely. Patient informed she will be seeing a new provider next visit.    Patrick NorthAVI, Lorcan Shelp, MD 03/24/2014

## 2014-04-05 ENCOUNTER — Encounter (HOSPITAL_COMMUNITY): Payer: Self-pay | Admitting: *Deleted

## 2014-04-05 NOTE — Progress Notes (Signed)
Hydroxyzine Pamoate authorized by [employer] and patient notified by phone

## 2014-04-05 NOTE — Progress Notes (Signed)
Initial  Prior Author[facility] for hydroxyzine [facility] as not on Formulary. 04/05/14 @ 1424: Faxed "Non-Formulary Drug Request Form" with [facility] of need [facility]

## 2014-04-16 ENCOUNTER — Other Ambulatory Visit (HOSPITAL_COMMUNITY): Payer: Self-pay | Admitting: *Deleted

## 2014-04-16 MED ORDER — HYDROXYZINE PAMOATE 25 MG PO CAPS: 25.0000 mg | ORAL_CAPSULE | Freq: Three times a day (TID) | ORAL | Status: DC | PRN

## 2014-04-16 NOTE — Telephone Encounter (Signed)
received faxed refill request for hydroxyzine 90 day supply Reviewed with Dr. Lucianne Muss in Dr. Merlene Morse absence 90 day supply authorized by Dr. Lucianne Muss

## 2014-05-20 ENCOUNTER — Ambulatory Visit (HOSPITAL_COMMUNITY): Payer: Self-pay | Admitting: Psychiatry

## 2014-05-27 ENCOUNTER — Ambulatory Visit (HOSPITAL_COMMUNITY): Payer: Self-pay | Admitting: Psychiatry

## 2014-06-08 ENCOUNTER — Ambulatory Visit (HOSPITAL_COMMUNITY): Payer: Self-pay | Admitting: Psychiatry

## 2014-06-17 ENCOUNTER — Ambulatory Visit (HOSPITAL_COMMUNITY): Payer: Self-pay | Admitting: Psychiatry

## 2014-06-29 ENCOUNTER — Encounter (HOSPITAL_COMMUNITY): Payer: Self-pay | Admitting: Psychiatry

## 2014-06-29 ENCOUNTER — Ambulatory Visit (INDEPENDENT_AMBULATORY_CARE_PROVIDER_SITE_OTHER): Payer: No Typology Code available for payment source | Admitting: Psychiatry

## 2014-06-29 VITALS — BP 97/64 | HR 86 | Ht 60.5 in | Wt 90.8 lb

## 2014-06-29 DIAGNOSIS — F3130 Bipolar disorder, current episode depressed, mild or moderate severity, unspecified: Secondary | ICD-10-CM

## 2014-06-29 DIAGNOSIS — F509 Eating disorder, unspecified: Secondary | ICD-10-CM

## 2014-06-29 MED ORDER — BUPROPION HCL ER (XL) 300 MG PO TB24: 300.0000 mg | ORAL_TABLET | ORAL | Status: DC

## 2014-06-29 MED ORDER — TOPIRAMATE 100 MG PO TABS: 100.0000 mg | ORAL_TABLET | Freq: Every day | ORAL | Status: DC

## 2014-06-29 MED ORDER — ZIPRASIDONE HCL 20 MG PO CAPS: 20.0000 mg | ORAL_CAPSULE | Freq: Two times a day (BID) | ORAL | Status: DC

## 2014-06-29 NOTE — Progress Notes (Signed)
Patient ID: [name], female   DOB: 24-Sep-1971, 43 y.o.   MRN: [id number]  [facility] [id number] [facility]  [name] [id number] 43 y.o.  06/29/2014 1:34 PM  Chief Complaint: "I feel depressed"  History of Present Illness: Patient is a 43 year old Caucasian woman with a history of bipolar disorder and eating disorder for here for a followup appointment. Reports recent death in the family.   Pt states she is depressed and has intense fatigue. Husband tells her she is staring off into space on a daily basis. Pt states that is her time to rest and take a break. She feels irritation when her husband interrupts her break time. Pt is isolating herself and not returning calls to friends. Pt is reporting anhedonia and low motivation for anything. She does house chores because she has to but doesn't enjoy anything. Pt used to enjoy cooking elaborate meals. Sleep is poor and she is getting about 4 hrs of broken sleep per night. Pt takes Trazodone and Vistaril at bedtime and it helps her to fall asleep. Denies hopelessness. Reports she is irritable.  Pt denies manic like symptoms including episodes of decreased need for sleep, increased energy, mood lability and impulsivity.   She reports eating really well. She is eating a lot of carbs and baked goods. She has always had an eating disorder but states she has been able to bring it under control.   Endorsing panic attacks about once a week. States it is manageable.   Has been seeing a new therapist and the feels it has been highly beneficial.   Pt is taking meds as prescribed and denies SE.   Suicidal Ideation: No Plan Formed: No Patient has means to carry out plan: No  Homicidal Ideation: No Plan Formed: No Patient has means to carry out plan: No  Review of Systems: Psychiatric: Agitation: No Hallucination: Yes hears muffled voices in the background about 3x/week. Happening on a more frequent basis. Depressed Mood:  Yes Insomnia: Yes Hypersomnia: No Altered Concentration: No Feels Worthless: No Grandiose Ideas: No Belief In Special Powers: No New/Increased Substance Abuse: No Compulsions: No  Neurologic: Headache: Yes Seizure: No Paresthesias: No  Past Medical, Family, Social History: Denies any medical illnesses. GM had depression, Father and grandfather had Bipolar disorder. Also reports family hx of schizophrenia. Denies drug, alcohol and nicotine use. Pt is married and lives her with husband.   Outpatient Encounter Prescriptions as of 06/29/2014  Medication Sig  . buPROPion (WELLBUTRIN XL) 150 MG 24 hr tablet Take 1 tablet (150 mg total) by mouth every morning.  Marland Kitchen. FLUoxetine (PROZAC) 20 MG capsule Take 3 capsules (60 mg total) by mouth daily.  . hydrOXYzine (VISTARIL) 25 MG capsule Take 1 capsule (25 mg total) by mouth 3 (three) times daily as needed.  . Norgestimate-Ethinyl Estradiol Triphasic (ORTHO TRI-CYCLEN LO) 0.18/0.215/0.25 MG-25 MCG tab Take 1 tablet by mouth daily.  Marland Kitchen. topiramate (TOPAMAX) 100 MG tablet Take 1 tablet (100 mg total) by mouth daily.  . traZODone (DESYREL) 100 MG tablet Take 1 tablet (100 mg total) by mouth at bedtime.  . ziprasidone (GEODON) 20 MG capsule Take 1 capsule (20 mg total) by mouth 2 (two) times daily with a meal.    Past Psychiatric History/Hospitalization(s): Anxiety: Yes Bipolar Disorder: Yes Depression: No Mania: No Psychosis: No Schizophrenia: No Personality Disorder: No Hospitalization for psychiatric illness: Yes History of Electroconvulsive Shock Therapy: No Prior Suicide Attempts: No  Physical Exam: Constitutional: Filed Vitals:  06/29/14 1322  Height: 5' 0.5" (1.537 m)  Weight: 90 lb 12.8 oz (41.187 kg)   Filed Vitals:   06/29/14 1322  BP: 97/64  Pulse: 86      General Appearance: alert, oriented, no acute distress  Musculoskeletal: Strength & Muscle Tone: within normal limits Gait & Station: normal Patient leans:  N/A  Mental Status Examination/Evaluation: Objective: Attitude: Calm and cooperative  Appearance: Casual and Neat, appears to be stated age  Eye Contact::  Fair  Speech:  Clear and Coherent and Normal Rate  Volume:  Normal  Mood:  depressed  Affect:  Congruent  Thought Process:  Goal Directed, Linear and Logical  Orientation:  Full (Time, Place, and Person)  Thought Content:  WDL  Suicidal Thoughts:  No  Homicidal Thoughts:  No  Judgement:  Fair  Insight:  Fair  Concentration: good  Memory: Immediate-fair Recent-fair Remote-fair  Recall: fair  Language: fair  Gait and Station: normal  [employer] of Knowledge: average  Psychomotor Activity:  Normal  Akathisia:  No  Handed:  Right  AIMS (if indicated):  Facial and Oral Movements  Muscles of Facial Expression: None, normal  Lips and Perioral Area: None, normal  Jaw: None, normal  Tongue: None, normal Extremity Movements: Upper (arms, wrists, hands, fingers): None, normal  Lower (legs, knees, ankles, toes): None, normal,  Trunk Movements:  Neck, shoulders, hips: None, normal,  Overall Severity : Severity of abnormal movements (highest score from questions above): None, normal  Incapacitation due to abnormal movements: None, normal  Patient's awareness of abnormal movements (rate only patient's report): No Awareness, Dental Status  Current problems with teeth and/or dentures?: No  Does patient usually wear dentures?: No       Medical Decision Making (Choose Three): Review of Psycho-Social Stressors (1), Order AIMS Test (2), Established Problem, Worsening (2), Review of Medication Regimen & Side Effects (2) and Review of New Medication or Change in Dosage (2)  Assessment: Axis I: Bipolar disorder- current episode depressed, Eating disorder nos  Axis II: deferred  Axis III: denies  Axis IV: limited coping skills  Axis V: GAF of 60   Plan: Continue Prozac 60mg  po qD for mood -Geodon 20mg  po BID for mood  lability -Trazodone 100mg  po qHS for sleep -Topamax 100mg  po qHS for mood lability- pt started this medication in the 1997's for treatment of of seizures  -Vistaril 25mg  po tid prn for anxiety. -Increase Wellbutrin to 300mg  po qD for mood. Pt is aware that can cause seizures, especially in individuals with a hx of seizures. Reports last seizure was in 1997. Has tolerated Wellbutrin without SE and would like to continue taking it.  -Risks and benefits, side effects and alternatives discussed with patient, pt was given an opportunity to ask questions about medication, illness, and treatment. All current psychiatric medications have been reviewed and discussed with the patient and adjusted as clinically appropriate. The patient has been provided an accurate and updated list of the medications being now prescribed.  Encouraged to continue therapy.  Continue to monitor weight closely.  Will order labs and EKG at next appointment.   Therapy: brief supportive therapy provided. Discussed psychosocial stressors in detail.    Pt denies SI and is at an acute low risk for suicide.Patient told to call clinic if any problems occur. Patient advised to go to ER if they should develop SI/HI, side effects, or if symptoms worsen. Has crisis numbers to call if needed. Pt verbalized understanding.  F/up in 3  months or sooner if needed     Oletta Darter, MD 06/29/2014

## 2014-09-30 ENCOUNTER — Encounter (HOSPITAL_COMMUNITY): Payer: Self-pay | Admitting: Psychiatry

## 2014-09-30 ENCOUNTER — Ambulatory Visit (INDEPENDENT_AMBULATORY_CARE_PROVIDER_SITE_OTHER): Payer: No Typology Code available for payment source | Admitting: Psychiatry

## 2014-09-30 VITALS — BP 96/72 | HR 82 | Ht 60.5 in | Wt 92.8 lb

## 2014-09-30 DIAGNOSIS — Z79899 Other long term (current) drug therapy: Secondary | ICD-10-CM

## 2014-09-30 DIAGNOSIS — F411 Generalized anxiety disorder: Secondary | ICD-10-CM | POA: Insufficient documentation

## 2014-09-30 DIAGNOSIS — F509 Eating disorder, unspecified: Secondary | ICD-10-CM

## 2014-09-30 DIAGNOSIS — F3130 Bipolar disorder, current episode depressed, mild or moderate severity, unspecified: Secondary | ICD-10-CM

## 2014-09-30 MED ORDER — TOPIRAMATE 100 MG PO TABS: 100.0000 mg | ORAL_TABLET | Freq: Every day | ORAL | Status: DC

## 2014-09-30 MED ORDER — CLONIDINE HCL 0.1 MG PO TABS: 0.1000 mg | ORAL_TABLET | Freq: Every day | ORAL | Status: DC | PRN

## 2014-09-30 MED ORDER — TRAZODONE HCL 100 MG PO TABS: 100.0000 mg | ORAL_TABLET | Freq: Every day | ORAL | Status: DC

## 2014-09-30 MED ORDER — FLUOXETINE HCL 20 MG PO CAPS: 60.0000 mg | ORAL_CAPSULE | Freq: Every day | ORAL | Status: DC

## 2014-09-30 MED ORDER — ZIPRASIDONE HCL 60 MG PO CAPS: 60.0000 mg | ORAL_CAPSULE | Freq: Every day | ORAL | Status: DC

## 2014-09-30 NOTE — Progress Notes (Signed)
[facility] [id number] Progress Note  [name] [id number] 43 y.o.  09/30/2014 3:14 PM  Chief Complaint: "I have hypothyroidism"  History of Present Illness: Patient is a 43 year old Caucasian woman with a history of bipolar disorder and eating disorder for here for a followup appointment.  Pt was recently diagnosed with hypothyroidism. Due to palpitations and hypertension she was advised to stop the Wellbutrin one week ago. Pt stopped taking it and notes physical symptoms have improved but she has no energy or motivation. She has not gotten out of bed all week and all she wants to do is sleep.  Today is the first day she has showered in 3 days. She is not sure if she depressed. She has no opinion on her mood. She only wants to lie in bed. Pt feels emotionless. She is not sure if she is sad. Pt is reporting anhedonia, worthlessness and isolation. Energy is low and she is exhausted. Sleeping all the time. Appetite is poor and she is eating one snack a day.   Pt denies manic like symptoms including episodes of decreased need for sleep, increased energy, mood lability and impulsivity.   Endorsing panic attacks about once a week. States it is manageable. States anxiety is high and she is having racing thoughts and restlessness.   Has been seeing her therapist and feels it has been highly beneficial. Therapist feels pt should start group therapy but pt isn't ready.   Pt is taking meds as prescribed and denies SE.   Suicidal Ideation: No Plan Formed: No Patient has means to carry out plan: No  Homicidal Ideation: No Plan Formed: No Patient has means to carry out plan: No  Review of Systems: Psychiatric: Agitation: Yes Hallucination: Yes hears muffled voices in the background about 3x/week. Happening on a more frequent basis. Depressed Mood: unsure Insomnia: No Hypersomnia: Yes Altered Concentration: No Feels Worthless: Yes Grandiose Ideas: No Belief In Special Powers:  No New/Increased Substance Abuse: No Compulsions: No  Neurologic: Headache: Yes Seizure: No Paresthesias: No  Review of Systems  Constitutional: Negative for fever and chills.  HENT: Negative for congestion, nosebleeds and sore throat.   Eyes: Negative for blurred vision, double vision and redness.  Respiratory: Negative for cough, sputum production and shortness of breath.   Cardiovascular: Negative for chest pain, palpitations and leg swelling.  Gastrointestinal: Negative for heartburn, nausea, vomiting and abdominal pain.  Musculoskeletal: Negative for back pain, joint pain and neck pain.  Skin: Negative for itching and rash.  Neurological: Positive for headaches. Negative for dizziness, tingling, seizures and weakness.  Psychiatric/Behavioral: Positive for depression and hallucinations. Negative for suicidal ideas and substance abuse. The patient is nervous/anxious. The patient does not have insomnia.      Past Medical, Family, Social History:  Denies drug, alcohol and nicotine use. Pt is married and lives her with husband. Pt is unemployed.   Family History  Problem Relation Age of Onset  . Schizophrenia Father   . Bipolar disorder Father   . Schizophrenia Paternal Grandmother   . Suicidality Neg Hx    Past Medical History  Diagnosis Date  . Bipolar 1 disorder   . Eating disorder   . Thyroid disease     Outpatient Encounter Prescriptions as of 09/30/2014  Medication Sig  . FLUoxetine (PROZAC) 20 MG capsule Take 3 capsules (60 mg total) by mouth daily.  . hydrOXYzine (VISTARIL) 25 MG capsule Take 1 capsule (25 mg total) by mouth 3 (three) times daily as needed.  Marland Kitchen  Norgestimate-Ethinyl Estradiol Triphasic (ORTHO TRI-CYCLEN LO) 0.18/0.215/0.25 MG-25 MCG tab Take 1 tablet by mouth daily.  Marland Kitchen. topiramate (TOPAMAX) 100 MG tablet Take 1 tablet (100 mg total) by mouth daily.  . traZODone (DESYREL) 100 MG tablet Take 1 tablet (100 mg total) by mouth at bedtime.  . ziprasidone  (GEODON) 20 MG capsule Take 1 capsule (20 mg total) by mouth 2 (two) times daily with a meal.  . buPROPion (WELLBUTRIN XL) 300 MG 24 hr tablet Take 1 tablet (300 mg total) by mouth every morning.  Marland Kitchen. levothyroxine (SYNTHROID, LEVOTHROID) 25 MCG tablet Take 25 mcg by mouth daily before breakfast.    Past Psychiatric History/Hospitalization(s): Anxiety: Yes Bipolar Disorder: Yes Depression: No Mania: No Psychosis: No Schizophrenia: No Personality Disorder: No Hospitalization for psychiatric illness: Yes History of Electroconvulsive Shock Therapy: No Prior Suicide Attempts: No  Physical Exam: Constitutional: Filed Vitals:   09/30/14 1513  Height: 5' 0.5" (1.537 m)  Weight: 92 lb 12.8 oz (42.094 kg)   Filed Vitals:   09/30/14 1513  BP: 96/72  Pulse: 82      General Appearance: alert, oriented, no acute distress  Musculoskeletal: Strength & Muscle Tone: within normal limits Gait & Station: normal Patient leans: N/A  Mental Status Examination/Evaluation: Objective: Attitude: Calm and cooperative  Appearance: Fairly Groomed, appears to be stated age  Patent attorneyye Contact::  Fair  Speech:  Clear and Coherent and Normal Rate  Volume:  Normal  Mood:  depressed  Affect:  Congruent  Thought Process:  Goal Directed, Linear and Logical  Orientation:  Full (Time, Place, and Person)  Thought Content:  WDL  Suicidal Thoughts:  No  Homicidal Thoughts:  No  Judgement:  Fair  Insight:  Fair  Concentration: good  Memory: Immediate-fair Recent-fair Remote-fair  Recall: fair  Language: fair  Gait and Station: normal  [employer] of Knowledge: average  Psychomotor Activity:  Normal  Akathisia:  No  Handed:  Right  AIMS (if indicated):  Facial and Oral Movements  Muscles of Facial Expression: None, normal  Lips and Perioral Area: None, normal  Jaw: None, normal  Tongue: None, normal Extremity Movements: Upper (arms, wrists, hands, fingers): None, normal  Lower (legs, knees,  ankles, toes): None, normal,  Trunk Movements:  Neck, shoulders, hips: None, normal,  Overall Severity : Severity of abnormal movements (highest score from questions above): None, normal  Incapacitation due to abnormal movements: None, normal  Patient's awareness of abnormal movements (rate only patient's report): No Awareness, Dental Status  Current problems with teeth and/or dentures?: No  Does patient usually wear dentures?: No       Medical Decision Making (Choose Three): Review of Psycho-Social Stressors (1), Established Problem, Worsening (2), Review of Medication Regimen & Side Effects (2) and Review of New Medication or Change in Dosage (2)  Assessment: Axis I: Bipolar disorder- current episode depressed; GAD; Eating disorder nos  Axis II: deferred  Axis III:  Past Medical History  Diagnosis Date  . Bipolar 1 disorder   . Eating disorder   . Thyroid disease    Axis IV: limited coping skills  Axis V: GAF of 60   Plan: Continue Prozac 60mg  po qD for mood -increase Geodon to 60mg  po qHS for mood lability -Trazodone 100mg  po qHS for sleep -Topamax 100mg  po qHS for mood lability- pt started this medication in the 1997's for treatment of of seizures  -d/c Vistaril  -start trial of Clonidine 0.1mg  po qD prn for anxiety. -d/c Wellbutrin  Medication management with supportive therapy. Risks/benefits and SE of the medication discussed. Pt verbalized understanding and verbal consent obtained for treatment.  Affirm with the patient that the medications are taken as ordered. Patient expressed understanding of how their medications were to be used.  -worsening of symptoms  Encouraged to continue therapy.  Continue to monitor weight closely.  Labs: pt will sign MR to get copy of recent labs from PCP. Ordered EKG  Therapy: brief supportive therapy provided. Discussed psychosocial stressors in detail.    Pt denies SI and is at an acute low risk for suicide.Patient told to  call clinic if any problems occur. Patient advised to go to ER if they should develop SI/HI, side effects, or if symptoms worsen. Has crisis numbers to call if needed. Pt verbalized understanding.  F/up in 3 months or sooner if needed     Oletta DarterAGARWAL, Jerelene Salaam, MD 09/30/2014

## 2014-09-30 NOTE — Patient Instructions (Signed)
EKG [phone] 

## 2014-10-08 ENCOUNTER — Telehealth (HOSPITAL_COMMUNITY): Payer: Self-pay | Admitting: *Deleted

## 2014-10-08 NOTE — Telephone Encounter (Signed)
Pt called left a message stating she has been unable to get out of bed since last week. Has not been taking Wellbutrin and wants to know if she has to add it back or can she just leave it off her medication list. Attempted to call patient back with no answer.

## 2014-10-12 ENCOUNTER — Telehealth (HOSPITAL_COMMUNITY): Payer: Self-pay

## 2014-10-12 NOTE — Telephone Encounter (Signed)
Called and left a voice message for call back.   

## 2014-10-12 NOTE — Telephone Encounter (Signed)
Called and left voice message for call back to discuss her med concerns.

## 2014-10-14 NOTE — Telephone Encounter (Signed)
Called and left voice message to discuss pt's concerns.

## 2014-10-19 ENCOUNTER — Telehealth (HOSPITAL_COMMUNITY): Payer: Self-pay

## 2014-10-19 ENCOUNTER — Ambulatory Visit (HOSPITAL_COMMUNITY): Payer: Self-pay | Admitting: Psychiatry

## 2014-10-19 NOTE — Telephone Encounter (Signed)
Pt states she can't get herself out of bed. She feels like things have gotten worse since she got off the Wellbutrin. States the Wellbutrin 300mg  made her feel like she was having panic attacks.   She had an EKG that showed her heart rate is very low and was advised to cut her Clonidine in half.  A/P:  Bipolar disorder- current episode depressed, Eating disorder nos 1. Restart Wellbutrin XL 150mg  po qD. Pt is aware that it can cause seizures, especially in individuals with a hx of seizures. Reports last seizure was in 1997. Has tolerated Wellbutrin without SE and would like to continue taking it.  2. Continue Prozac 60mg  po qD for mood  3. Geodon to 60mg  po qHS for mood lability  4. Trazodone 100mg  po qHS for sleep  5. Topamax 100mg  po qHS for mood lability- pt started this medication in the 1997's for treatment of of seizures  6. Clonidine 0.05mg  po qD prn for anxiety

## 2014-10-19 NOTE — Telephone Encounter (Signed)
Called and left a voice message asking pt to please call back. She was scheduled to see this provider today at 11am and was a no show.

## 2014-11-17 ENCOUNTER — Other Ambulatory Visit (HOSPITAL_COMMUNITY): Payer: Self-pay | Admitting: Psychiatry

## 2014-11-18 NOTE — Telephone Encounter (Signed)
Chart reviewed, medication had been discontinued. Refill denied.

## 2014-11-22 ENCOUNTER — Other Ambulatory Visit (HOSPITAL_COMMUNITY): Payer: Self-pay | Admitting: Psychiatry

## 2014-11-25 ENCOUNTER — Other Ambulatory Visit (HOSPITAL_COMMUNITY): Payer: Self-pay | Admitting: Psychiatry

## 2014-12-02 ENCOUNTER — Encounter (HOSPITAL_COMMUNITY): Payer: Self-pay | Admitting: Psychiatry

## 2014-12-02 ENCOUNTER — Ambulatory Visit (INDEPENDENT_AMBULATORY_CARE_PROVIDER_SITE_OTHER): Payer: PPO | Admitting: Psychiatry

## 2014-12-02 VITALS — BP 114/57 | HR 77 | Ht 60.5 in | Wt 93.0 lb

## 2014-12-02 DIAGNOSIS — F509 Eating disorder, unspecified: Secondary | ICD-10-CM

## 2014-12-02 DIAGNOSIS — F3130 Bipolar disorder, current episode depressed, mild or moderate severity, unspecified: Secondary | ICD-10-CM

## 2014-12-02 DIAGNOSIS — F411 Generalized anxiety disorder: Secondary | ICD-10-CM

## 2014-12-02 MED ORDER — TOPIRAMATE 100 MG PO TABS: ORAL_TABLET | ORAL | Status: DC

## 2014-12-02 MED ORDER — TRAZODONE HCL 100 MG PO TABS: 100.0000 mg | ORAL_TABLET | Freq: Every day | ORAL | Status: DC

## 2014-12-02 MED ORDER — ZIPRASIDONE HCL 60 MG PO CAPS: ORAL_CAPSULE | ORAL | Status: DC

## 2014-12-02 MED ORDER — BUPROPION HCL ER (XL) 150 MG PO TB24: 150.0000 mg | ORAL_TABLET | Freq: Every day | ORAL | Status: DC

## 2014-12-02 MED ORDER — CLONIDINE HCL 0.1 MG PO TABS: 0.1000 mg | ORAL_TABLET | Freq: Two times a day (BID) | ORAL | Status: DC | PRN

## 2014-12-02 MED ORDER — FLUOXETINE HCL 20 MG PO CAPS: 60.0000 mg | ORAL_CAPSULE | Freq: Every day | ORAL | Status: DC

## 2014-12-02 NOTE — Progress Notes (Signed)
[facility] [id number] Progress Note  [name] [id number] 44 y.o.  12/02/2014 2:11 PM  Chief Complaint: "much better."  History of Present Illness: Patient is a 44 year old Caucasian woman with a history of bipolar disorder and eating disorder for here for a followup appointment.  Depression is "moderate" and improved as compared to a few weeks ago. States Wellbutrin is helping a lot. She is having low motivation but has a desire to be more active out the house. Self esteem is low. She has on/off worthlessness and reports hopelessness. She is more active in the house and is no longer spending all her time in bed. She has some anhedonia and isolation. Denies crying spells. Sleep has improved and she is getting about 7 hrs. Energy has improved with Wellbutrin. Appetite is good.   In the past she has been several mood stabilizers and they all seem to stop working after about 7 yrs. Feels her lack of motivation is related Geodon use.   Pt denies manic like symptoms including episodes of decreased need for sleep, increased energy, mood lability and impulsivity.   Panic attacks have decreased to a few time a month. Clonidine helps during panic attacks and before bed. States it is manageable. States anxiety is decreased.   Has been seeing her therapist and feels it has been highly beneficial. Therapist feels pt should start group therapy but pt isn't ready.   Pt is taking meds as prescribed and denies SE.   Suicidal Ideation: No Plan Formed: No Patient has means to carry out plan: No  Homicidal Ideation: No Plan Formed: No Patient has means to carry out plan: No  Review of Systems: Psychiatric: Agitation: No Hallucination: No  Depressed Mood: Yes Insomnia: No Hypersomnia: No Altered Concentration: No Feels Worthless: Yes Grandiose Ideas: No Belief In Special Powers: No New/Increased Substance Abuse: No Compulsions: No  Neurologic: Headache: No Seizure:  No Paresthesias: No  Review of Systems  Constitutional: Negative for fever and chills.  HENT: Negative for congestion, nosebleeds and sore throat.   Eyes: Negative for blurred vision, double vision and redness.  Respiratory: Negative for cough, sputum production and shortness of breath.   Cardiovascular: Negative for chest pain, palpitations and leg swelling.  Gastrointestinal: Positive for constipation. Negative for heartburn, nausea, vomiting and abdominal pain.  Musculoskeletal: Negative for back pain, joint pain and neck pain.  Skin: Negative for itching and rash.  Neurological: Negative for dizziness, tingling, seizures, weakness and headaches.  Psychiatric/Behavioral: Positive for depression. Negative for suicidal ideas, hallucinations and substance abuse. The patient is nervous/anxious. The patient does not have insomnia.      Past Medical, Family, Social History:  Denies drug, alcohol and nicotine use. Pt is married and lives her with husband. Pt is unemployed.   Family History  Problem Relation Age of Onset  . Schizophrenia Father   . Bipolar disorder Father   . Schizophrenia Paternal Grandmother   . Suicidality Neg Hx    Past Medical History  Diagnosis Date  . Bipolar 1 disorder   . Eating disorder   . Thyroid disease     Outpatient Encounter Prescriptions as of 12/02/2014  Medication Sig  . buPROPion (WELLBUTRIN XL) 150 MG 24 hr tablet Take 150 mg by mouth daily.  . cloNIDine (CATAPRES) 0.1 MG tablet TAKE 1 BY MOUTH DAILY AS NEEDED FOR PANIC ATTACKS  . FLUoxetine (PROZAC) 20 MG capsule Take 3 capsules (60 mg total) by mouth daily.  . Norgestimate-Ethinyl Estradiol Triphasic (ORTHO TRI-CYCLEN  LO) 0.18/0.215/0.25 MG-25 MCG tab Take 1 tablet by mouth daily.  Marland Kitchen topiramate (TOPAMAX) 100 MG tablet TAKE 1 BY MOUTH DAILY  . traZODone (DESYREL) 100 MG tablet Take 1 tablet (100 mg total) by mouth at bedtime.  . ziprasidone (GEODON) 60 MG capsule TAKE 1 BY MOUTH AT BEDTIME  .  levothyroxine (SYNTHROID, LEVOTHROID) 25 MCG tablet Take 25 mcg by mouth daily before breakfast.    Past Psychiatric History/Hospitalization(s): Anxiety: Yes Bipolar Disorder: Yes Depression: No Mania: No Psychosis: No Schizophrenia: No Personality Disorder: No Hospitalization for psychiatric illness: Yes History of Electroconvulsive Shock Therapy: No Prior Suicide Attempts: No  Physical Exam: Constitutional: There were no vitals filed for this visit. There were no vitals filed for this visit.  General Appearance: alert, oriented, no acute distress  Musculoskeletal: Strength & Muscle Tone: within normal limits Gait & Station: normal Patient leans: N/A  Mental Status Examination/Evaluation: Objective: Attitude: Calm and cooperative  Appearance: Fairly Groomed, appears to be stated age  Patent attorney::  Fair  Speech:  Clear and Coherent and Normal Rate  Volume:  Normal  Mood:  depressed  Affect:  Congruent, more interactive and calmer at this visit as compared to last  Thought Process:  Goal Directed, Linear and Logical  Orientation:  Full (Time, Place, and Person)  Thought Content:  Negative  Suicidal Thoughts:  No  Homicidal Thoughts:  No  Judgement:  Fair  Insight:  Fair  Concentration: good  Memory: Immediate-fair Recent-fair Remote-fair  Recall: fair  Language: fair  Gait and Station: normal  [employer] of Knowledge: average  Psychomotor Activity:  Normal  Akathisia:  No  Handed:  Right  AIMS (if indicated):  Facial and Oral Movements  Muscles of Facial Expression: mild Lips and Perioral Area: None, normal  Jaw: None, normal  Tongue: None, normal Extremity Movements: Upper (arms, wrists, hands, fingers): None, normal  Lower (legs, knees, ankles, toes): None, normal,  Trunk Movements:  Neck, shoulders, hips: None, normal,  Overall Severity : Severity of abnormal movements (highest score from questions above): None, normal  Incapacitation due to  abnormal movements: None, normal  Patient's awareness of abnormal movements (rate only patient's report): No Awareness, Dental Status  Current problems with teeth and/or dentures?: No  Does patient usually wear dentures?: No       Medical Decision Making (Choose Three): Established Problem, Stable/Improving (1), Review of Psycho-Social Stressors (1), Review or order clinical lab tests (1) and Review of Medication Regimen & Side Effects (2)  Assessment: Axis I: Bipolar disorder- current episode depressed; GAD; Eating disorder nos  Axis II: deferred  Axis III:  Past Medical History  Diagnosis Date  . Bipolar 1 disorder   . Eating disorder   . Thyroid disease    Axis IV: limited coping skills  Axis V: GAF of 60   Plan: 1. Wellbutrin XL  po qD. Pt is aware that it can cause seizures, especially in individuals with a hx of seizures. Reports last seizure was in 1997. Has tolerated Wellbutrin without SE and would like to continue taking it.  2. Continue Prozac  po qD for mood  3. Geodon   po qHS for mood lability  4. Trazodone  po qHS for sleep  5. Topamax  po qHS for mood lability- pt started this medication in the 1997's for treatment of of seizures  6. Clonidine 0.05mg  po qD prn for anxiety Consider switching Geodon to Jordan. Pt will research and discuss at next appt  Medication management  with supportive therapy. Risks/benefits and SE of the medication discussed. Pt verbalized understanding and verbal consent obtained for treatment.  Affirm with the patient that the medications are taken as ordered. Patient expressed understanding of how their medications were to be used.  -improvement of symptoms  Encouraged to continue therapy.  Continue to monitor weight closely.  Labs: reviewed labs from PCP (07/21/2014) CBC WNL, Chol 109, ALP 24, TSH 4.67 EKG results pending  Therapy: brief supportive therapy provided. Discussed psychosocial stressors in  detail.    Pt denies SI and is at an acute low risk for suicide.Patient told to call clinic if any problems occur. Patient advised to go to ER if they should develop SI/HI, side effects, or if symptoms worsen. Has crisis numbers to call if needed. Pt verbalized understanding.  F/up in 3 months or sooner if needed   Oletta DarterAGARWAL, Carrigan Delafuente, MD 12/02/2014

## 2015-03-03 ENCOUNTER — Ambulatory Visit (INDEPENDENT_AMBULATORY_CARE_PROVIDER_SITE_OTHER): Payer: PPO | Admitting: Psychiatry

## 2015-03-03 ENCOUNTER — Encounter (HOSPITAL_COMMUNITY): Payer: Self-pay | Admitting: Psychiatry

## 2015-03-03 VITALS — BP 105/60 | HR 70 | Ht 60.5 in | Wt 98.0 lb

## 2015-03-03 DIAGNOSIS — F319 Bipolar disorder, unspecified: Secondary | ICD-10-CM | POA: Diagnosis not present

## 2015-03-03 DIAGNOSIS — F3130 Bipolar disorder, current episode depressed, mild or moderate severity, unspecified: Secondary | ICD-10-CM

## 2015-03-03 DIAGNOSIS — F411 Generalized anxiety disorder: Secondary | ICD-10-CM | POA: Diagnosis not present

## 2015-03-03 DIAGNOSIS — F509 Eating disorder, unspecified: Secondary | ICD-10-CM

## 2015-03-03 DIAGNOSIS — F310 Bipolar disorder, current episode hypomanic: Secondary | ICD-10-CM | POA: Insufficient documentation

## 2015-03-03 MED ORDER — LURASIDONE HCL 20 MG PO TABS: 20.0000 mg | ORAL_TABLET | Freq: Every day | ORAL | Status: DC

## 2015-03-03 MED ORDER — CLONIDINE HCL 0.1 MG PO TABS: 0.1000 mg | ORAL_TABLET | Freq: Two times a day (BID) | ORAL | Status: DC | PRN

## 2015-03-03 MED ORDER — TOPIRAMATE 100 MG PO TABS: ORAL_TABLET | ORAL | Status: DC

## 2015-03-03 MED ORDER — BUPROPION HCL ER (XL) 150 MG PO TB24: 150.0000 mg | ORAL_TABLET | Freq: Every day | ORAL | Status: DC

## 2015-03-03 MED ORDER — TRAZODONE HCL 100 MG PO TABS
150.0000 mg | ORAL_TABLET | Freq: Every day | ORAL | Status: DC
Start: 2015-03-03 — End: 2015-05-03

## 2015-03-03 MED ORDER — FLUOXETINE HCL 20 MG PO CAPS: 60.0000 mg | ORAL_CAPSULE | Freq: Every day | ORAL | Status: DC

## 2015-03-03 NOTE — Progress Notes (Signed)
Patient ID: [name], female   DOB: August 07, 1971, 44 y.o.   MRN: [id number] [facility] [id number] Progress Note  [name] [id number] 44 y.o.  03/03/2015 2:09 PM  Chief Complaint: "rough"  History of Present Illness: Patient is a 44 year old Caucasian woman with a history of bipolar disorder and eating disorder for here for a followup appointment.  Pt states she feels manic. States she wants to change her mood stabilizer. Pt is only sleeping 3 hrs/night and she is feeling restless. States she shopping more online in the middle of the night. In the last 2 months she has charged $4000. Mood is mildly elevated. "my body is exhausted but my mind is not". Worries about her heart palpitation. Notes at night her mouth is moving and she has a lot of eye blinking. Her husband notes it is happening at night as well.   Depression is "ok". She is social and getting out with friends. States Wellbutrin is helping a lot. She was having low motivation but it is improving alot. Self esteem is improving.  She denies worthlessness and reports hopelessness. She is more active in the house and is no longer spending all her time in bed.  Denies crying spells.   Appetite is good and she has gained 3 lbs.   Panic attacks every night when she lies down. Clonidine no longer helps during panic attacks and before bed. States anxiety is increased.  Has been seeing her therapist less often and has reached a platue.   In the past she has been several mood stabilizers and they all seem to stop working after about 7 yrs. Feels scared to try something besides Geodon.  Pt is taking meds as prescribed and denies SE.   Suicidal Ideation: No Plan Formed: No Patient has means to carry out plan: No  Homicidal Ideation: No Plan Formed: No Patient has means to carry out plan: No  Review of Systems: Psychiatric: Agitation: a little Hallucination: No  Depressed Mood: No Insomnia: Yes Hypersomnia: No Altered  Concentration: No Feels Worthless: No Grandiose Ideas: No Belief In Special Powers: No New/Increased Substance Abuse: No Compulsions: No  Neurologic: Headache: No Seizure: No Paresthesias: No  Review of Systems  Constitutional: Negative for fever, chills and weight loss.  HENT: Negative for congestion, hearing loss and sore throat.   Eyes: Negative for blurred vision, double vision and pain.  Respiratory: Negative for cough, shortness of breath and wheezing.   Cardiovascular: Positive for palpitations. Negative for chest pain and leg swelling.  Gastrointestinal: Negative for heartburn, nausea, vomiting and abdominal pain.  Musculoskeletal: Negative for back pain, joint pain and neck pain.  Skin: Negative for itching and rash.  Neurological: Negative for dizziness, sensory change, seizures, loss of consciousness and headaches.  Psychiatric/Behavioral: Negative for depression, suicidal ideas, hallucinations and substance abuse. The patient is nervous/anxious and has insomnia.      Past Medical, Family, Social History:  Denies drug, alcohol and nicotine use. Pt is married and lives her with husband. Pt is unemployed.   Family History  Problem Relation Age of Onset  . Schizophrenia Father   . Bipolar disorder Father   . Schizophrenia Paternal Grandmother   . Suicidality Neg Hx    Past Medical History  Diagnosis Date  . Bipolar 1 disorder   . Eating disorder   . Thyroid disease     Outpatient Encounter Prescriptions as of 03/03/2015  Medication Sig  . buPROPion (WELLBUTRIN XL) 150 MG 24 hr tablet  Take 1 tablet (150 mg total) by mouth daily.  . cloNIDine (CATAPRES) 0.1 MG tablet Take 1 tablet (0.1 mg total) by mouth 2 (two) times daily as needed (anxiety).  Marland Kitchen FLUoxetine (PROZAC) 20 MG capsule Take 3 capsules (60 mg total) by mouth daily.  Marland Kitchen levothyroxine (SYNTHROID, LEVOTHROID) 25 MCG tablet Take 25 mcg by mouth daily before breakfast.  . Norgestimate-Ethinyl Estradiol  Triphasic (ORTHO TRI-CYCLEN LO) 0.18/0.215/0.25 MG-25 MCG tab Take 1 tablet by mouth daily.  Marland Kitchen topiramate (TOPAMAX) 100 MG tablet TAKE 1 BY MOUTH DAILY  . traZODone (DESYREL) 100 MG tablet Take 1 tablet (100 mg total) by mouth at bedtime.  . ziprasidone (GEODON) 60 MG capsule TAKE 1 BY MOUTH AT BEDTIME    Past Psychiatric History/Hospitalization(s): Anxiety: Yes Bipolar Disorder: Yes Depression: No Mania: No Psychosis: No Schizophrenia: No Personality Disorder: No Hospitalization for psychiatric illness: Yes History of Electroconvulsive Shock Therapy: No Prior Suicide Attempts: No  Physical Exam: Constitutional: Filed Vitals:   03/03/15 1434  Height: 5' 0.5" (1.537 m)  Weight: 98 lb (44.453 kg)   Filed Vitals:   03/03/15 1434  BP: 105/60  Pulse: 70    General Appearance: alert, oriented, no acute distress  Musculoskeletal: Strength & Muscle Tone: within normal limits Gait & Station: normal Patient leans: N/A  Mental Status Examination/Evaluation: Objective: Attitude: Calm and cooperative  Appearance: Fairly Groomed, appears to be stated age  Patent attorney::  Fair  Speech:  Clear and Coherent and Normal Rate  Volume:  Normal  Mood: anxious  Affect:  Labile  Thought Process:  Goal Directed, Linear and Logical  Orientation:  Full (Time, Place, and Person)  Thought Content:  Negative  Suicidal Thoughts:  No  Homicidal Thoughts:  No  Judgement:  Fair  Insight:  Fair  Concentration: good  Memory: Immediate-fair Recent-fair Remote-fair  Recall: fair  Language: fair  Gait and Station: normal  [employer] of Knowledge: average  Psychomotor Activity:  Normal  Akathisia:  No  Handed:  Right  AIMS (if indicated):  Facial and Oral Movements  Muscles of Facial Expression: mild Lips and Perioral Area: None, normal  Jaw: None, normal  Tongue: None, normal Extremity Movements: Upper (arms, wrists, hands, fingers): None, normal  Lower (legs, knees, ankles, toes):  None, normal,  Trunk Movements:  Neck, shoulders, hips: None, normal,  Overall Severity : Severity of abnormal movements (highest score from questions above): None, normal  Incapacitation due to abnormal movements: None, normal  Patient's awareness of abnormal movements (rate only patient's report): No Awareness, Dental Status  Current problems with teeth and/or dentures?: No  Does patient usually wear dentures?: No       Medical Decision Making (Choose Three): Review of Psycho-Social Stressors (1), Order AIMS Test (2), Established Problem, Worsening (2), Review of Medication Regimen & Side Effects (2) and Review of New Medication or Change in Dosage (2)  Assessment: Axis I: Bipolar disorder- current episode manic; GAD; Eating disorder nos  Axis II: deferred  Axis III:  Past Medical History  Diagnosis Date  . Bipolar 1 disorder   . Eating disorder   . Thyroid disease    Axis IV: limited coping skills  Axis V: GAF of 60   Plan: 1. Wellbutrin XL  po qD. Pt is aware that it can cause seizures, especially in individuals with a hx of seizures. Reports last seizure was in 1997. Has tolerated Wellbutrin without SE and would like to continue taking it.  2. Continue Prozac  po qD  for mood  3. D/c Geodon   4. Start trial of Latuda  po qHS for mood lability  4. Trazodone increase to  po qHS for sleep . NO script given today as pt has a lot at home 5. Topamax  po qHS for mood lability- pt started this medication in the 1997's for treatment of of seizures  6. Clonidine 0.05mg  po qD prn for anxiety  Medication management with supportive therapy. Risks/benefits and SE of the medication discussed. Pt verbalized understanding and verbal consent obtained for treatment.  Affirm with the patient that the medications are taken as ordered. Patient expressed understanding of how their medications were to be used.  -worsening of symptoms  Encouraged to continue  therapy.  Continue to monitor weight closely.  Labs: reviewed labs from PCP (07/21/2014) CBC WNL, Chol 109, ALP 24, TSH 4.67 EKG results pending  Therapy: brief supportive therapy provided. Discussed psychosocial stressors in detail.    Pt denies SI and is at an acute low risk for suicide.Patient told to call clinic if any problems occur. Patient advised to go to ER if they should develop SI/HI, side effects, or if symptoms worsen. Has crisis numbers to call if needed. Pt verbalized understanding.  F/up in 2 months or sooner if needed  Oletta Darter, MD 03/03/2015

## 2015-03-04 ENCOUNTER — Other Ambulatory Visit (HOSPITAL_COMMUNITY): Payer: Self-pay

## 2015-03-04 DIAGNOSIS — F3130 Bipolar disorder, current episode depressed, mild or moderate severity, unspecified: Secondary | ICD-10-CM

## 2015-03-04 NOTE — Telephone Encounter (Signed)
Patient's pharmacy, [facility] left a message requesting to change patient's new Latuda order to a 90 day supply and would like Dr. Michae KavaAgarwal to send them a new prescription if approved.

## 2015-03-08 MED ORDER — LURASIDONE HCL 20 MG PO TABS: 20.0000 mg | ORAL_TABLET | Freq: Every day | ORAL | Status: DC

## 2015-03-08 NOTE — Telephone Encounter (Signed)
We are trying a new medication. I am not sure how she is going to do with it. I prefer a 30 day supply until we know more.

## 2015-03-08 NOTE — Telephone Encounter (Signed)
Spoke with Duwayne Heckanielle at [facility] advised medication Kasandra KnudsenLatuda is a new medication per Dr. Michae KavaAgarwal she would like to keep it at 30 day supply. Account was noted. Patient will have to get filled at local pharmacy because [facility] is only a 90 day pharmacy, Danielle cancelled RX in the system at [facility].   I called patient.  I advised patient Latuda cannot be filled at [facility] because it will have to be written at a 90 day supply and Dr. Michae KavaAgarwal being a new medication would like to keep it at a 30 day supply.  Patient verbalized understanding, [facility] [place].

## 2015-03-14 ENCOUNTER — Telehealth (HOSPITAL_COMMUNITY): Payer: Self-pay

## 2015-03-14 DIAGNOSIS — F310 Bipolar disorder, current episode hypomanic: Secondary | ICD-10-CM

## 2015-03-14 NOTE — Telephone Encounter (Signed)
Telephone call from patient stated she took JordanLatuda for 5 days and had nausea, vomiting and diarrhea throughout the time she tried to stay on the new medication.  Patient reported she restarted her Geodon yesterday and the GI upset completely resolved after stopping the JordanLatuda.  Patient requests a refill of Geodon and to return to taking it.  States she tried the JordanLatuda thinking the Geodon was loosing it's effectiveness but now wants to stay on the Geodon as does not have the GI problems with it and also thinks it is still effective for her illness.  Patient requests a new order for her Geodon be sent to [facility] [place] [address].  States she will call back on 03/15/15 to see if Dr. Michae KavaAgarwal agrees with the change but states she is not taking any more of the Latuda because of how bad it made her feel.  Patient ruled out any virus or other cause for GI upset due to timing of when it occurred and stopped around the taking of the new medication.

## 2015-03-15 MED ORDER — ZIPRASIDONE HCL 60 MG PO CAPS: 60.0000 mg | ORAL_CAPSULE | Freq: Every day | ORAL | Status: DC

## 2015-03-15 NOTE — Telephone Encounter (Signed)
Met with Dr. Doyne Keel who agreed with patient's request to return to taking Geodon 27m, one at bedtime.  New order authorized and sent in to patient's [facility] refill to last patient until next appointment 05/03/15.  Left patient a voicemail message this order was approved and e-scribed to patient's [facility]  Requested patient call back if any questions.

## 2015-04-18 ENCOUNTER — Telehealth (HOSPITAL_COMMUNITY): Payer: Self-pay | Admitting: *Deleted

## 2015-04-18 NOTE — Telephone Encounter (Signed)
Via fax from [facility].  Request for patients Ziprasidone HCL 60 mg to be refilled.  Medication was last sent 03-15-15 with one refill.  Patient is due for refill on 05-15-15.  Patients follow up visit is scheduled for 05-03-15.

## 2015-04-19 NOTE — Telephone Encounter (Signed)
Telephone call with patient to follow up on her request for a refill of Geodon.  Informed patient a new order was e[facility]scribed into [facility] [place] on 03/15/15 plus one refill.  Patient stated thinking the [facility] had gone to [facility] but they had told her they had no more refills.  Questioned if patient wanted to have the prescription transferred and patient stated this would "be even better".  Stated plan to pick up needed refill this date and reminded of appointment set for 05/03/15.

## 2015-04-19 NOTE — Telephone Encounter (Signed)
Is she out of the Geodon already?

## 2015-05-03 ENCOUNTER — Encounter (HOSPITAL_COMMUNITY): Payer: Self-pay | Admitting: Psychiatry

## 2015-05-03 ENCOUNTER — Ambulatory Visit (INDEPENDENT_AMBULATORY_CARE_PROVIDER_SITE_OTHER): Payer: PPO | Admitting: Psychiatry

## 2015-05-03 VITALS — BP 116/62 | HR 93 | Ht 60.5 in | Wt 103.4 lb

## 2015-05-03 DIAGNOSIS — F319 Bipolar disorder, unspecified: Secondary | ICD-10-CM | POA: Diagnosis not present

## 2015-05-03 DIAGNOSIS — F3130 Bipolar disorder, current episode depressed, mild or moderate severity, unspecified: Secondary | ICD-10-CM

## 2015-05-03 DIAGNOSIS — F5000 Anorexia nervosa, unspecified: Secondary | ICD-10-CM | POA: Diagnosis not present

## 2015-05-03 DIAGNOSIS — F411 Generalized anxiety disorder: Secondary | ICD-10-CM

## 2015-05-03 DIAGNOSIS — F310 Bipolar disorder, current episode hypomanic: Secondary | ICD-10-CM

## 2015-05-03 MED ORDER — TRAZODONE HCL 100 MG PO TABS: 150.0000 mg | ORAL_TABLET | Freq: Every day | ORAL | Status: DC

## 2015-05-03 MED ORDER — TOPIRAMATE 100 MG PO TABS: ORAL_TABLET | ORAL | Status: DC

## 2015-05-03 MED ORDER — BUPROPION HCL ER (XL) 150 MG PO TB24: 150.0000 mg | ORAL_TABLET | Freq: Every day | ORAL | Status: DC

## 2015-05-03 MED ORDER — FLUOXETINE HCL 20 MG PO CAPS: 60.0000 mg | ORAL_CAPSULE | Freq: Every day | ORAL | Status: DC

## 2015-05-03 MED ORDER — CLONIDINE HCL 0.1 MG PO TABS
0.1000 mg | ORAL_TABLET | Freq: Two times a day (BID) | ORAL | Status: DC | PRN
Start: 2015-05-03 — End: 2015-08-16

## 2015-05-03 MED ORDER — ZIPRASIDONE HCL 60 MG PO CAPS: 60.0000 mg | ORAL_CAPSULE | Freq: Every day | ORAL | Status: DC

## 2015-05-03 NOTE — Progress Notes (Signed)
[facility] [id number] Progress Note  Zayonna Jeffie PollockKelly Facer [id number] 44 y.o.  05/03/2015 2:30 PM  Chief Complaint: "I feel like I am doing really well right now. "  History of Present Illness: Patient is a 44 year old Caucasian woman with a history of bipolar disorder and eating disorder for here for a followup appointment.  During the summer and spring she typically does really well. Fall and winter are not good but increasing Wellbutrin to 300mg  usually helps.   Denies manic and hypomanic symptoms including periods of decreased need for sleep, increased energy, mood lability, impulsivity, FOI, and excessive spending.  Depression is improved. She is social and getting out with friends. States Wellbutrin is helping a lot. She was having low motivation but it is improving alot. Self esteem is improving.  She denies worthlessness and reports hopelessness. She is more active in the house and is no longer spending all her time in bed.  Denies crying spells.   Appetite is good and she has gained another 3 lbs. Pt is working hard on increasing weight.   Panic attacks are less frequent. They are occuring once a week.  Clonidine helps to prevent them. She takes one after dinner and then again at bedtime. States anxiety is decreased.  Pt is sleeping 7 hrs/night and energy is good. Concentration is good.   Pt is not in therapy currently but knows how to reach them if needed.   Geodon was restarted about 4 weeks ago. She wants to continue it. Latuda didn't work for her and feels Geodon is keeping her more stable.   Pt is taking meds as prescribed and denies SE.   Suicidal Ideation: No Plan Formed: No Patient has means to carry out plan: No  Homicidal Ideation: No Plan Formed: No Patient has means to carry out plan: No  Review of Systems: Psychiatric: Agitation: No Hallucination: No  Depressed Mood: No Insomnia: Yes Hypersomnia: No Altered Concentration: No Feels Worthless:  No Grandiose Ideas: No Belief In Special Powers: No New/Increased Substance Abuse: No Compulsions: No  Neurologic: Headache: No Seizure: No Paresthesias: No  Review of Systems  Constitutional: Negative for fever, chills and weight loss.  HENT: Negative for congestion, ear pain, nosebleeds and sore throat.   Eyes: Negative for blurred vision, double vision, pain and redness.  Respiratory: Negative for cough, sputum production and wheezing.   Cardiovascular: Negative for chest pain, palpitations and leg swelling.  Gastrointestinal: Negative for heartburn, nausea, vomiting and abdominal pain.  Musculoskeletal: Negative for back pain, joint pain and neck pain.  Skin: Negative for rash.  Neurological: Negative for dizziness, sensory change, seizures, loss of consciousness and headaches.  Psychiatric/Behavioral: Negative for depression, suicidal ideas, hallucinations and substance abuse. The patient is nervous/anxious. The patient does not have insomnia.      Past Medical, Family, Social History:  Denies drug, alcohol and nicotine use. Pt is married and lives her with husband. Pt is unemployed and has not worked since 2012. She used to work at [employer] but was fired due to mania. She is long term disability since she was 44yo.  Reports she has been fired from several jobs due to her mania.    Family History  Problem Relation Age of Onset  . Schizophrenia Father   . Bipolar disorder Father   . Schizophrenia Paternal Grandmother   . Suicidality Neg Hx    Past Medical History  Diagnosis Date  . Bipolar 1 disorder   . Eating disorder   . Thyroid  disease     Outpatient Encounter Prescriptions as of 05/03/2015  Medication Sig  . buPROPion (WELLBUTRIN XL) 150 MG 24 hr tablet Take 1 tablet (150 mg total) by mouth daily.  . cloNIDine (CATAPRES) 0.1 MG tablet Take 1 tablet (0.1 mg total) by mouth 2 (two) times daily as needed (anxiety).  Marland Kitchen FLUoxetine (PROZAC) 20 MG capsule Take 3 capsules (60  mg total) by mouth daily.  Marland Kitchen levothyroxine (SYNTHROID, LEVOTHROID) 25 MCG tablet Take 25 mcg by mouth daily before breakfast.  . Norgestimate-Ethinyl Estradiol Triphasic (ORTHO TRI-CYCLEN LO) 0.18/0.215/0.25 MG-25 MCG tab Take 1 tablet by mouth daily.  Marland Kitchen topiramate (TOPAMAX) 100 MG tablet TAKE 1 BY MOUTH DAILY  . traZODone (DESYREL) 100 MG tablet Take 1.5 tablets (150 mg total) by mouth at bedtime.  . ziprasidone (GEODON) 60 MG capsule Take 1 capsule (60 mg total) by mouth at bedtime.   No facility-administered encounter medications on file as of 05/03/2015.    Past Psychiatric History/Hospitalization(s): Anxiety: Yes Bipolar Disorder: Yes Depression: No Mania: No Psychosis: No Schizophrenia: No Personality Disorder: No Hospitalization for psychiatric illness: Yes last time in 2012 at [facility], reports hx of multiple previous hospitalizations.  History of Electroconvulsive Shock Therapy: No Prior Suicide Attempts: No  Physical Exam: Constitutional: Filed Vitals:   05/03/15 1420  Height: 5' 0.5" (1.537 m)  Weight: 103 lb 6.4 oz (46.902 kg)   Filed Vitals:   05/03/15 1420  BP: 116/62  Pulse: 93    General Appearance: alert, oriented, no acute distress  Musculoskeletal: Strength & Muscle Tone: within normal limits Gait & Station: normal Patient leans: N/A  Mental Status Examination/Evaluation: Objective: Attitude: Calm and cooperative  Appearance: Fairly Groomed, appears to be stated age  Patent attorney::  Fair  Speech:  Clear and Coherent and Normal Rate  Volume:  Normal  Mood: anxious-mild  Affect:  Full Range  Thought Process:  Goal Directed, Linear and Logical  Orientation:  Full (Time, Place, and Person)  Thought Content:  Negative  Suicidal Thoughts:  No  Homicidal Thoughts:  No  Judgement:  Fair  Insight:  Fair  Concentration: good  Memory: Immediate-fair Recent-fair Remote-fair  Recall: fair  Language: fair  Gait and Station: normal  [employer] of  Knowledge: average  Psychomotor Activity:  Normal  Akathisia:  No  Handed:  Right  AIMS (if indicated):  Facial and Oral Movements  Muscles of Facial Expression: mild Lips and Perioral Area: None, normal  Jaw: None, normal  Tongue: None, normal Extremity Movements: Upper (arms, wrists, hands, fingers): None, normal  Lower (legs, knees, ankles, toes): None, normal,  Trunk Movements:  Neck, shoulders, hips: None, normal,  Overall Severity : Severity of abnormal movements (highest score from questions above): None, normal  Incapacitation due to abnormal movements: None, normal  Patient's awareness of abnormal movements (rate only patient's report): No Awareness, Dental Status  Current problems with teeth and/or dentures?: No  Does patient usually wear dentures?: No       Medical Decision Making (Choose Three): Review of Psycho-Social Stressors (1), Order AIMS Test (2), Established Problem, Worsening (2), Review of Medication Regimen & Side Effects (2) and Review of New Medication or Change in Dosage (2)  Assessment: Axis I: Bipolar disorder- current episode unspecified; GAD; Anorexia Nervosa  Axis II: deferred  Axis III:  Past Medical History  Diagnosis Date  . Bipolar 1 disorder   . Eating disorder   . Thyroid disease    Axis IV: limited coping skills  Axis  V: GAF of 60   Plan: 1. Wellbutrin XL  po qD. Pt is aware that it can cause seizures, especially in individuals with a hx of seizures. Reports last seizure was in 1997. Has tolerated Wellbutrin without SE and would like to continue taking it.  2. Continue Prozac  po qD for mood  3. Restart Geodon   po qD for mood lability 4. D/c Latuda   4. Trazodone  po qHS for sleep .  5. Topamax  po qHS for mood lability- pt started this medication in the 1997's for treatment of of seizures  6. Clonidine 0.1mg  po BID prn for anxiety  Medication management with supportive therapy. Risks/benefits and SE  of the medication discussed. Pt verbalized understanding and verbal consent obtained for treatment.  Affirm with the patient that the medications are taken as ordered. Patient expressed understanding of how their medications were to be used.  -improvement of symptoms  Encouraged to continue therapy when necessary  Continue to monitor weight closely.  Labs: reviewed labs from PCP (07/21/2014) CBC WNL, Chol 109, ALP 24, TSH 4.67 EKG results pending Order labs at next visit.  Therapy: brief supportive therapy provided. Discussed psychosocial stressors in detail.    Pt denies SI and is at an acute low risk for suicide.Patient told to call clinic if any problems occur. Patient advised to go to ER if they should develop SI/HI, side effects, or if symptoms worsen. Has crisis numbers to call if needed. Pt verbalized understanding.  F/up in 4 months or sooner if needed  Oletta Darter, MD 05/03/2015

## 2015-05-17 ENCOUNTER — Telehealth (HOSPITAL_COMMUNITY): Payer: Self-pay

## 2015-05-17 NOTE — Telephone Encounter (Signed)
Telephone call from patient questioning if Dr. Michae Kava had completed due disability forms back to [facility].  Discussed patient's history and currently receiving SSDI since 11/29/97 per patient.  Agreed to call patient back once forms complete as she will return to pick these up.  Informed this is Dr. Lemar Lofty last day prior to maternity leave so would have these after today.

## 2015-05-17 NOTE — Telephone Encounter (Signed)
Met with Dr. Doyne Keel to complete [facility] forms to verify patient's disability.  Left patient a message the forms were completed and prepared for her to pick up.

## 2015-06-10 ENCOUNTER — Ambulatory Visit (HOSPITAL_COMMUNITY): Payer: Self-pay | Admitting: Psychiatry

## 2015-06-21 ENCOUNTER — Ambulatory Visit (HOSPITAL_COMMUNITY): Payer: Self-pay | Admitting: Psychiatry

## 2015-08-16 ENCOUNTER — Encounter (HOSPITAL_COMMUNITY): Payer: Self-pay | Admitting: Psychiatry

## 2015-08-16 ENCOUNTER — Ambulatory Visit (INDEPENDENT_AMBULATORY_CARE_PROVIDER_SITE_OTHER): Payer: PPO | Admitting: Psychiatry

## 2015-08-16 VITALS — BP 105/64 | HR 90 | Ht 60.5 in | Wt 108.8 lb

## 2015-08-16 DIAGNOSIS — F310 Bipolar disorder, current episode hypomanic: Secondary | ICD-10-CM

## 2015-08-16 DIAGNOSIS — F411 Generalized anxiety disorder: Secondary | ICD-10-CM | POA: Diagnosis not present

## 2015-08-16 DIAGNOSIS — Z79899 Other long term (current) drug therapy: Secondary | ICD-10-CM

## 2015-08-16 DIAGNOSIS — G4700 Insomnia, unspecified: Secondary | ICD-10-CM

## 2015-08-16 DIAGNOSIS — F319 Bipolar disorder, unspecified: Secondary | ICD-10-CM | POA: Diagnosis not present

## 2015-08-16 DIAGNOSIS — F3130 Bipolar disorder, current episode depressed, mild or moderate severity, unspecified: Secondary | ICD-10-CM

## 2015-08-16 DIAGNOSIS — F5000 Anorexia nervosa, unspecified: Secondary | ICD-10-CM | POA: Diagnosis not present

## 2015-08-16 MED ORDER — ZIPRASIDONE HCL 60 MG PO CAPS: 60.0000 mg | ORAL_CAPSULE | Freq: Every day | ORAL | Status: DC

## 2015-08-16 MED ORDER — BUPROPION HCL ER (XL) 300 MG PO TB24: 300.0000 mg | ORAL_TABLET | Freq: Every day | ORAL | Status: DC

## 2015-08-16 MED ORDER — TOPIRAMATE 100 MG PO TABS: ORAL_TABLET | ORAL | Status: DC

## 2015-08-16 MED ORDER — FLUOXETINE HCL 20 MG PO CAPS: 60.0000 mg | ORAL_CAPSULE | Freq: Every day | ORAL | Status: DC

## 2015-08-16 MED ORDER — ESZOPICLONE 2 MG PO TABS: 2.0000 mg | ORAL_TABLET | Freq: Every evening | ORAL | Status: DC | PRN

## 2015-08-16 NOTE — Progress Notes (Signed)
Patient ID: [name], female   DOB: 12/24/1970, 44 y.o.   MRN: [id number] [facility] [id number] Progress Note  [name] [id number] 44 y.o.  08/16/2015 3:56 PM  Chief Complaint: "mood is so happy. "  History of Present Illness: Patient is a 44 year old Caucasian woman with a history of bipolar disorder and eating disorder for here for a followup appointment.  States she has been good. She is not depressed. She is social and getting out with friends. States Wellbutrin is helping a lot. Denies low motivation and self esteem. Denies worthlessness and hopelessness. She is more active in the house and is no longer spending all her time in bed.  Denies crying spells. Pt feels happy and working out daily.    During the summer and spring she typically does really well. Fall and winter are not good but increasing Wellbutrin to  usually helps so pt increased the dose to  at the beginning of Sept.   Pt is sleeping about 4 hrs/night. Pt is worried about her sleeping and doesn't want to develop mania. Energy is good.   Denies manic and hypomanic symptoms including periods of decreased need for sleep, increased energy, mood lability, impulsivity, FOI, and excessive spending.  States her eating disorder is under control. Appetite is good and she has gained another 5 lbs. Pt is working hard on increasing weight. Pt is not happy about her weight gain but others have been telling her she looks good so she is trying to accept it.   Panic attacks are less frequent and mild. They are occuring once a week.  Clonidine is not very effective and she is taking it nightly. States she doesn't like it and wants to stop it. States anxiety is only a problem at night when she is trying to sleep.   Pt will start restart therapy in October.    Pt is taking meds as prescribed and denies SE.   Suicidal Ideation: No Plan Formed: No Patient has means to carry out plan: No  Homicidal Ideation: No  Plan Formed: No Patient has means to carry out plan: No  Review of Systems: Psychiatric: Agitation: No Hallucination: No  Depressed Mood: No Insomnia: Yes Hypersomnia: No Altered Concentration: No Feels Worthless: No Grandiose Ideas: No Belief In Special Powers: No New/Increased Substance Abuse: No Compulsions: No  Neurologic: Headache: No Seizure: No Paresthesias: No  Review of Systems  Constitutional: Negative for fever, chills and weight loss.  HENT: Negative for congestion, ear pain, nosebleeds and sore throat.   Eyes: Negative for blurred vision, double vision, pain and redness.  Respiratory: Negative for cough, sputum production and wheezing.   Cardiovascular: Negative for chest pain, palpitations and leg swelling.  Gastrointestinal: Negative for heartburn, nausea, vomiting and abdominal pain.  Musculoskeletal: Negative for back pain, joint pain and neck pain.  Skin: Negative for rash.  Neurological: Negative for dizziness, sensory change, seizures, loss of consciousness and headaches.  Psychiatric/Behavioral: Negative for depression, suicidal ideas, hallucinations and substance abuse. The patient is nervous/anxious. The patient does not have insomnia.      Past Medical, Family, Social History:  Denies drug, alcohol and nicotine use. Pt is married and lives her with husband. Pt is unemployed and has not worked since 2012. She used to work at [employer] but was fired due to mania. She is long term disability since she was 44yo.  Reports she has been fired from several jobs due to her mania.    Family History  Problem Relation Age of Onset  . Schizophrenia Father   . Bipolar disorder Father   . Schizophrenia Paternal Grandmother   . Suicidality Neg Hx    Past Medical History  Diagnosis Date  . Bipolar 1 disorder   . Eating disorder   . Thyroid disease     Outpatient Encounter Prescriptions as of 08/16/2015  Medication Sig  . buPROPion (WELLBUTRIN XL) 150 MG 24 hr  tablet Take 1 tablet (150 mg total) by mouth daily. (Patient taking differently: Take 300 mg by mouth daily. )  . cloNIDine (CATAPRES) 0.1 MG tablet Take 1 tablet (0.1 mg total) by mouth 2 (two) times daily as needed (anxiety).  Marland Kitchen FLUoxetine (PROZAC) 20 MG capsule Take 3 capsules (60 mg total) by mouth daily.  Marland Kitchen levothyroxine (SYNTHROID, LEVOTHROID) 25 MCG tablet Take 25 mcg by mouth daily before breakfast.  . Norgestimate-Ethinyl Estradiol Triphasic (ORTHO TRI-CYCLEN LO) 0.18/0.215/0.25 MG-25 MCG tab Take 1 tablet by mouth daily.  Marland Kitchen topiramate (TOPAMAX) 100 MG tablet TAKE 1 BY MOUTH DAILY  . ziprasidone (GEODON) 60 MG capsule Take 1 capsule (60 mg total) by mouth at bedtime.  . traZODone (DESYREL) 100 MG tablet Take 1.5 tablets (150 mg total) by mouth at bedtime. (Patient not taking: Reported on 08/16/2015)   No facility-administered encounter medications on file as of 08/16/2015.    Past Psychiatric History/Hospitalization(s): Anxiety: Yes Bipolar Disorder: Yes Depression: No Mania: No Psychosis: No Schizophrenia: No Personality Disorder: No Hospitalization for psychiatric illness: Yes last time in 2012 at [facility], reports hx of multiple previous hospitalizations.  History of Electroconvulsive Shock Therapy: No Prior Suicide Attempts: No  Physical Exam: Constitutional: Filed Vitals:   08/16/15 1538  Height: 5' 0.5" (1.537 m)  Weight: 108 lb 12.8 oz (49.351 kg)   Filed Vitals:   08/16/15 1538  BP: 105/64  Pulse: 90    General Appearance: alert, oriented, no acute distress  Musculoskeletal: Strength & Muscle Tone: within normal limits Gait & Station: normal Patient leans: N/A  Mental Status Examination/Evaluation: Objective: Attitude: Calm and cooperative  Appearance: Fairly Groomed, appears to be stated age  Eye Contact::  Good  Speech:  Clear and Coherent and Normal Rate  Volume:  Normal  Mood:  euthymic  Affect:  Full Range  Thought Process:  Goal Directed, Linear and  Logical  Orientation:  Full (Time, Place, and Person)  Thought Content:  Negative  Suicidal Thoughts:  No  Homicidal Thoughts:  No  Judgement:  Good  Insight:  Good  Concentration: good  Memory: Immediate-good Recent-good Remote-good  Recall: fair  Language: fair  Gait and Station: normal  [employer] of Knowledge: average  Psychomotor Activity:  Normal  Akathisia:  No  Handed:  Right  AIMS (if indicated):  Facial and Oral Movements  Muscles of Facial Expression: None, normal  Lips and Perioral Area: None, normal  Jaw: None, normal  Tongue: None, normal Extremity Movements: Upper (arms, wrists, hands, fingers): None, normal  Lower (legs, knees, ankles, toes): None, normal,  Trunk Movements:  Neck, shoulders, hips: None, normal,  Overall Severity : Severity of abnormal movements (highest score from questions above): None, normal  Incapacitation due to abnormal movements: None, normal  Patient's awareness of abnormal movements (rate only patient's report): No Awareness, Dental Status  Current problems with teeth and/or dentures?: No  Does patient usually wear dentures?: No    Assets:  Communication Skills Desire for Improvement Financial Resources/Insurance Housing Intimacy Leisure Time Physical Health Resilience Social Support Energy manager  Medical Decision Making (Choose Three): Established Problem, Stable/Improving (1), Review of Psycho-Social Stressors (1), Review or order clinical lab tests (1), Established Problem, Worsening (2), Review of Medication Regimen & Side Effects (2) and Review of New Medication or Change in Dosage (2)  Assessment: Axis I: Bipolar disorder- current episode unspecified; GAD; Anorexia Nervosa; Insomnia  Axis II: deferred  Axis III:  Past Medical History  Diagnosis Date  . Bipolar 1 disorder   . Eating disorder   . Thyroid disease    Axis IV: limited coping skills  Axis V:  GAF of 60   Plan: 1. Increase Wellbutrin XL to  po qD. Pt is aware that it can cause seizures, especially in individuals with a hx of seizures. Reports last seizure was in 1997. Has tolerated Wellbutrin without SE and would like to continue taking it.  2. Continue Prozac  po qD for mood  3. Geodon   po qD for mood lability 4. D/c Trazodone as it is ineffective and start Lunesta  po qHS prn for sleep .  5. Topamax  po qHS for mood lability- pt started this medication in the 1997's for treatment of of seizures  6. d/c  Clonidine per pt request  Medication management with supportive therapy. Risks/benefits and SE of the medication discussed. Pt verbalized understanding and verbal consent obtained for treatment.  Affirm with the patient that the medications are taken as ordered. Patient expressed understanding of how their medications were to be used.  -improvement of symptoms  Encouraged to continue therapy when necessary  Continue to monitor weight closely.  Labs: CBC, CMP, HbA1c, Lipid panel, TSH, Prolactin level, EKG  Therapy: brief supportive therapy provided. Discussed psychosocial stressors in detail.   Encouraged to continue therapy  Pt denies SI and is at an acute low risk for suicide.Patient told to call clinic if any problems occur. Patient advised to go to ER if they should develop SI/HI, side effects, or if symptoms worsen. Has crisis numbers to call if needed. Pt verbalized understanding.  F/up in 3 months or sooner if needed  Oletta Darter, MD 08/16/2015

## 2015-08-16 NOTE — Patient Instructions (Signed)
1. EKG call [phone] 2. labs 

## 2015-08-24 ENCOUNTER — Telehealth (HOSPITAL_COMMUNITY): Payer: Self-pay

## 2015-08-25 ENCOUNTER — Telehealth (HOSPITAL_COMMUNITY): Payer: Self-pay

## 2015-08-25 DIAGNOSIS — G4700 Insomnia, unspecified: Secondary | ICD-10-CM

## 2015-08-25 MED ORDER — ZOLPIDEM TARTRATE 5 MG PO TABS: 5.0000 mg | ORAL_TABLET | Freq: Every evening | ORAL | Status: DC | PRN

## 2015-08-25 NOTE — Telephone Encounter (Signed)
Telephone call with patient to inform of medication change per Dr. Michae Kava to Ambien , one at bedtime as needed for sleep, #30 and to discontinue Lunesta.  Agreed to call in new Ambien order to patient's [facility] in [place] and spoke with Sam, pharmacist there to give new Ambien  order with no refills and to discontinue patient's recently prescribed Lunesta.  Patient agreed to call back if any problems with medication and covered potential side effects.

## 2015-08-25 NOTE — Telephone Encounter (Signed)
Medication management - Telephone call from pt who reports she has been taking prescribed Lunesta for over 1 week now and it causes her to be nauseous so does not want to continue.  Pt. reports discussed other options at last eval and questions Ambien as an alternative or something else Dr. Michae Kava might suggest.  Patient would like a call back this date with a new order or plans.

## 2015-08-25 NOTE — Telephone Encounter (Signed)
She can d/c Lunesta. We can prescribe Ambien  po qHS prn insomnia #30.

## 2015-09-02 ENCOUNTER — Ambulatory Visit (HOSPITAL_COMMUNITY): Payer: Self-pay | Admitting: Psychiatry

## 2015-09-08 ENCOUNTER — Ambulatory Visit (HOSPITAL_COMMUNITY): Payer: Self-pay | Admitting: Psychiatry

## 2015-09-14 ENCOUNTER — Ambulatory Visit (HOSPITAL_COMMUNITY): Payer: Self-pay | Admitting: Psychiatry

## 2015-09-22 ENCOUNTER — Telehealth (HOSPITAL_COMMUNITY): Payer: Self-pay

## 2015-09-22 DIAGNOSIS — G4700 Insomnia, unspecified: Secondary | ICD-10-CM

## 2015-09-22 MED ORDER — ZOLPIDEM TARTRATE 5 MG PO TABS: 5.0000 mg | ORAL_TABLET | Freq: Every evening | ORAL | Status: DC | PRN

## 2015-09-22 NOTE — Telephone Encounter (Signed)
Telephone call from patient requesting a refill of Ambien as states she only has 5 tablets left.  States she is taking one most days and a few times took one to help with anxiety but does use for sleep. Patient returns on 10/06/15 and requests a refill prior to then as was last filled 08/25/15, #30.

## 2015-09-22 NOTE — Telephone Encounter (Signed)
Called in a new Ambien 5 mg order, #30 with no refills to patient's [facility] in [place] and then called patient to inform Dr. Michae KavaAgarwal authorized a refill and reminded of need to keep appointment on 10/06/15.

## 2015-09-22 NOTE — Telephone Encounter (Signed)
Yes we can refill Ambien 5mg .

## 2015-09-29 ENCOUNTER — Ambulatory Visit (HOSPITAL_COMMUNITY): Payer: Self-pay | Admitting: Psychiatry

## 2015-10-06 ENCOUNTER — Ambulatory Visit (HOSPITAL_COMMUNITY): Payer: Self-pay | Admitting: Psychiatry

## 2015-10-17 ENCOUNTER — Telehealth (HOSPITAL_COMMUNITY): Payer: Self-pay

## 2015-10-17 DIAGNOSIS — F3130 Bipolar disorder, current episode depressed, mild or moderate severity, unspecified: Secondary | ICD-10-CM

## 2015-10-17 DIAGNOSIS — G4700 Insomnia, unspecified: Secondary | ICD-10-CM

## 2015-10-17 NOTE — Telephone Encounter (Signed)
Telephone call from patient requesting a new Ambien order last filled on 09/22/15 with no refills to be sent to her [facility] [place] and a new order for Wellbutrin at 150 mg a day as states she cut this back due to 300 mg making her too hyper and nervous.  Patient requested a new order for the Wellbutrin 150 mg dosage, one a day be sent to [facility] and would like a 90 day order of this if Dr. Michae KavaAgarwal approves.  Patient reported plant to keep next appointment on 11/15/15.  Agreed to contact patient back once orders discussed with Dr. Michae KavaAgarwal who will be back in our office on 10/18/15.  Patient states having enough Ambien until 10/23/15 and enough Wellbutrin 150 mg dosages until they mail a new order.

## 2015-10-18 ENCOUNTER — Other Ambulatory Visit (HOSPITAL_COMMUNITY): Payer: Self-pay | Admitting: Psychiatry

## 2015-10-18 MED ORDER — BUPROPION HCL ER (XL) 150 MG PO TB24: 150.0000 mg | ORAL_TABLET | Freq: Every day | ORAL | Status: DC

## 2015-10-18 MED ORDER — ZOLPIDEM TARTRATE 5 MG PO TABS: 5.0000 mg | ORAL_TABLET | Freq: Every evening | ORAL | Status: DC | PRN

## 2015-10-18 NOTE — Telephone Encounter (Signed)
Yes 90 days of Wellbutrin is fine.

## 2015-11-15 ENCOUNTER — Ambulatory Visit (INDEPENDENT_AMBULATORY_CARE_PROVIDER_SITE_OTHER): Payer: PPO | Admitting: Psychiatry

## 2015-11-15 ENCOUNTER — Encounter (HOSPITAL_COMMUNITY): Payer: Self-pay | Admitting: Psychiatry

## 2015-11-15 VITALS — BP 111/60 | HR 86 | Ht 60.5 in | Wt 111.0 lb

## 2015-11-15 DIAGNOSIS — F411 Generalized anxiety disorder: Secondary | ICD-10-CM

## 2015-11-15 DIAGNOSIS — F310 Bipolar disorder, current episode hypomanic: Secondary | ICD-10-CM

## 2015-11-15 DIAGNOSIS — G4700 Insomnia, unspecified: Secondary | ICD-10-CM

## 2015-11-15 DIAGNOSIS — F5000 Anorexia nervosa, unspecified: Secondary | ICD-10-CM

## 2015-11-15 DIAGNOSIS — F3130 Bipolar disorder, current episode depressed, mild or moderate severity, unspecified: Secondary | ICD-10-CM

## 2015-11-15 MED ORDER — BUPROPION HCL ER (XL) 150 MG PO TB24: 150.0000 mg | ORAL_TABLET | Freq: Every day | ORAL | Status: DC

## 2015-11-15 MED ORDER — FLUOXETINE HCL 20 MG PO CAPS: 60.0000 mg | ORAL_CAPSULE | Freq: Every day | ORAL | Status: DC

## 2015-11-15 MED ORDER — TOPIRAMATE 100 MG PO TABS: ORAL_TABLET | ORAL | Status: DC

## 2015-11-15 MED ORDER — ZOLPIDEM TARTRATE 10 MG PO TABS: 10.0000 mg | ORAL_TABLET | Freq: Every evening | ORAL | Status: DC | PRN

## 2015-11-15 MED ORDER — ZIPRASIDONE HCL 60 MG PO CAPS: 60.0000 mg | ORAL_CAPSULE | Freq: Every day | ORAL | Status: DC

## 2015-11-15 NOTE — Progress Notes (Signed)
[facility] [id number] Progress Note  Annette Jeffie PollockKelly Sanchez [id number] 44 y.o.  11/15/2015 10:38 AM  Chief Complaint: "I am doing remarkably well "  History of Present Illness: Patient is a 44 year old Caucasian woman with a history of bipolar disorder and eating disorder for here for a followup appointment.  States she has been good. She is not depressed. She is social and getting out with friends. States Wellbutrin is helping a lot. Denies low motivation and self esteem. Denies worthlessness and hopelessness. She is more active in the house and is no longer spending all her time in bed.  Denies crying spells. Pt feels happy and working out daily.   Pt did not increase dose of Wellbutrin to 300mg .   During the summer and spring she typically does really well. Fall and winter are not good but increasing Wellbutrin to 300mg  usually helps but did not need to do so this year.  Pt is sleeping about 6 hrs/night. Energy and appetite are good.   Denies manic and hypomanic symptoms including periods of decreased need for sleep, increased energy, mood lability, impulsivity, FOI, and excessive spending.  States her eating disorder is under control. Appetite is good and weight is stable at 110lbs. Pt would like to lose a couple of lbs but she knows she is healthy at this weight. She is more comfortable at 105 lbs.   Panic attacks are happening almost nightly. It happens when she lies down. It makes her worry that her heart is going to wear out.  PCP tells her BP is good. She has restarted Clonidine tabs and after 45 min she calms down. States she doesn't like it and wants to stop it. States Clonidine is not really helping.    Pt will start restart therapy soon.   Pt is taking meds as prescribed and denies SE.   Suicidal Ideation: No Plan Formed: No Patient has means to carry out plan: No  Homicidal Ideation: No Plan Formed: No Patient has means to carry out plan: No  Review of  Systems: Psychiatric: Agitation: No Hallucination: No  Depressed Mood: No Insomnia: Yes Hypersomnia: No Altered Concentration: No Feels Worthless: No Grandiose Ideas: No Belief In Special Powers: No New/Increased Substance Abuse: No Compulsions: No  Neurologic: Headache: No Seizure: No Paresthesias: No  Review of Systems  Constitutional: Negative for fever, chills and weight loss.  HENT: Negative for congestion, ear pain, nosebleeds and sore throat.   Eyes: Negative for blurred vision, double vision, pain and redness.  Respiratory: Negative for cough, sputum production and wheezing.   Cardiovascular: Positive for palpitations. Negative for chest pain and leg swelling.  Gastrointestinal: Negative for heartburn, nausea, vomiting and abdominal pain.  Musculoskeletal: Negative for back pain, joint pain and neck pain.  Skin: Negative for rash.  Neurological: Negative for dizziness, sensory change, seizures, loss of consciousness and headaches.  Psychiatric/Behavioral: Negative for depression, suicidal ideas, hallucinations and substance abuse. The patient is nervous/anxious. The patient does not have insomnia.      Past Medical, Family, Social History:  Denies drug, alcohol and nicotine use. Pt is married and lives her with husband. Pt is unemployed and has not worked since 2012. She used to work at [employer] but was fired due to mania. She is long term disability since she was 44yo.  Reports she has been fired from several jobs due to her mania.    Family History  Problem Relation Age of Onset  . Schizophrenia Father   . Bipolar disorder  Father   . Schizophrenia Paternal Grandmother   . Suicidality Neg Hx    Past Medical History  Diagnosis Date  . Bipolar 1 disorder (HCC)   . Eating disorder   . Thyroid disease     Outpatient Encounter Prescriptions as of 11/15/2015  Medication Sig  . buPROPion (WELLBUTRIN XL) 150 MG 24 hr tablet Take 1 tablet (150 mg total) by mouth daily.   . cloNIDine (CATAPRES) 0.1 MG tablet Take 0.1 mg by mouth daily as needed.  Marland Kitchen FLUoxetine (PROZAC) 20 MG capsule Take 3 capsules (60 mg total) by mouth daily.  . Norgestimate-Ethinyl Estradiol Triphasic (ORTHO TRI-CYCLEN LO) 0.18/0.215/0.25 MG-25 MCG tab Take 1 tablet by mouth daily.  Marland Kitchen topiramate (TOPAMAX) 100 MG tablet TAKE 1 BY MOUTH DAILY  . ziprasidone (GEODON) 60 MG capsule Take 1 capsule (60 mg total) by mouth at bedtime.  Marland Kitchen zolpidem (AMBIEN) 5 MG tablet Take 1 tablet (5 mg total) by mouth at bedtime as needed for sleep.  Marland Kitchen levothyroxine (SYNTHROID, LEVOTHROID) 25 MCG tablet Take 25 mcg by mouth daily before breakfast. Reported on 11/15/2015   No facility-administered encounter medications on file as of 11/15/2015.    Past Psychiatric History/Hospitalization(s): Anxiety: Yes Bipolar Disorder: Yes Depression: No Mania: No Psychosis: No Schizophrenia: No Personality Disorder: No Hospitalization for psychiatric illness: Yes last time in 2012 at [facility], reports hx of multiple previous hospitalizations.  History of Electroconvulsive Shock Therapy: No Prior Suicide Attempts: No  Physical Exam: Constitutional: Filed Vitals:   11/15/15 1034  Height: 5' 0.5" (1.537 m)  Weight: 111 lb (50.349 kg)   Filed Vitals:   11/15/15 1034  BP: 111/60  Pulse: 86    General Appearance: alert, oriented, no acute distress  Musculoskeletal: Strength & Muscle Tone: within normal limits Gait & Station: normal Patient leans: N/A  Mental Status Examination/Evaluation: Objective: Attitude: Calm and cooperative  Appearance: Fairly Groomed, appears to be stated age  Eye Contact::  Good  Speech:  Clear and Coherent and Normal Rate  Volume:  Normal  Mood:  anxious  Affect:  Congruent  Thought Process:  Goal Directed, Linear and Logical  Orientation:  Full (Time, Place, and Person)  Thought Content:  Negative  Suicidal Thoughts:  No  Homicidal Thoughts:  No  Judgement:  Good  Insight:  Good   Concentration: good  Memory: Immediate-good Recent-good Remote-good  Recall: fair  Language: fair  Gait and Station: normal  [employer] of Knowledge: average  Psychomotor Activity:  Normal  Akathisia:  No  Handed:  Right  AIMS (if indicated):  Facial and Oral Movements  Muscles of Facial Expression: None, normal  Lips and Perioral Area: None, normal  Jaw: None, normal  Tongue: None, normal Extremity Movements: Upper (arms, wrists, hands, fingers): None, normal  Lower (legs, knees, ankles, toes): None, normal,  Trunk Movements:  Neck, shoulders, hips: None, normal,  Overall Severity : Severity of abnormal movements (highest score from questions above): None, normal  Incapacitation due to abnormal movements: None, normal  Patient's awareness of abnormal movements (rate only patient's report): No Awareness, Dental Status  Current problems with teeth and/or dentures?: No  Does patient usually wear dentures?: No    Assets:  Communication Skills Desire for Improvement Financial Resources/Insurance Housing Intimacy Leisure Time Physical Health Resilience Social Support Engineer, maintenance (IT) Decision Making (Choose Three): Established Problem, Stable/Improving (1), Review of Psycho-Social Stressors (1), Review or order clinical lab tests (1), Established Problem, Worsening (  2), Review of Medication Regimen & Side Effects (2) and Review of New Medication or Change in Dosage (2)  Assessment: Axis I: Bipolar disorder- current episode unspecified; GAD; Anorexia Nervosa; Insomnia  Axis II: deferred    Plan: 1. decrease Wellbutrin XL to  po qD. Pt is aware that it can cause seizures, especially in individuals with a hx of seizures. Reports last seizure was in 1997. Has tolerated Wellbutrin without SE and would like to continue taking it.  2. Increase Prozac to  po qD for mood . Pt was taking .  3. Geodon   po qD for mood lability 4. Increase Ambien to  po qHS prn for sleep .  5. Topamax  po qHS for mood lability- pt started this medication in the 1997's for treatment of of seizures  6. d/c Clonidine per pt request  Medication management with supportive therapy. Risks/benefits and SE of the medication discussed. Pt verbalized understanding and verbal consent obtained for treatment.  Affirm with the patient that the medications are taken as ordered. Patient expressed understanding of how their medications were to be used.    Encouraged to continue therapy as soon as possible  Continue to monitor weight closely.  Labs: reviewed labs from PCP CBC WNL, CMP WNL, TSH WNL. Will scan into chart  EKG pending  Therapy: brief supportive therapy provided. Discussed psychosocial stressors in detail.   Encouraged to continue therapy  Pt denies SI and is at an acute low risk for suicide.Patient told to call clinic if any problems occur. Patient advised to go to ER if they should develop SI/HI, side effects, or if symptoms worsen. Has crisis numbers to call if needed. Pt verbalized understanding.  F/up in 2 months or sooner if needed  Oletta Darter, MD 11/15/2015

## 2015-11-15 NOTE — Patient Instructions (Signed)
Call [phone] for EKG 

## 2015-11-16 ENCOUNTER — Telehealth (HOSPITAL_COMMUNITY): Payer: Self-pay

## 2015-11-16 NOTE — Telephone Encounter (Signed)
Telephone call to pt's insurance to complete prior authorization and was given new number for [employer] patients @ [phone].  Called this number to assist with requested prior authorization for patient's increased Ambien to 10 mg, one at bedtime.  Representative, Daisey reported patient's insurance only covers 90 pills of Ambien a year.  Requested a plan quantity limit exception as explained patient's Ambien is for nightly use and not just 90 days.  Representative agreed to write this up as an appeal and stated it would be marked as urgent and should be responded back to within 24 hours.

## 2015-11-16 NOTE — Telephone Encounter (Signed)
Telephone call with patient to follow up on a message she left that she would need a prior authorization to be completed to obtain her increase in Ambien.  [facility] [address] as pharmacist verified patient needs a prior authorization completed for Ambien and provided patient's insurance #[id number] and phone # [phone].

## 2015-11-29 ENCOUNTER — Ambulatory Visit (HOSPITAL_COMMUNITY)
Admission: RE | Admit: 2015-11-29 | Discharge: 2015-11-29 | Disposition: A | Discharge status: Home / Self Care | Payer: PPO | Source: Ambulatory Visit | Attending: Psychiatry | Admitting: Psychiatry

## 2015-11-29 DIAGNOSIS — Z79899 Other long term (current) drug therapy: Secondary | ICD-10-CM | POA: Diagnosis not present

## 2015-12-22 ENCOUNTER — Ambulatory Visit (INDEPENDENT_AMBULATORY_CARE_PROVIDER_SITE_OTHER): Payer: PPO | Admitting: Psychiatry

## 2015-12-22 ENCOUNTER — Encounter (HOSPITAL_COMMUNITY): Payer: Self-pay | Admitting: Psychiatry

## 2015-12-22 VITALS — BP 122/86 | HR 84 | Ht 60.5 in | Wt 114.6 lb

## 2015-12-22 DIAGNOSIS — G4700 Insomnia, unspecified: Secondary | ICD-10-CM | POA: Diagnosis not present

## 2015-12-22 DIAGNOSIS — F3130 Bipolar disorder, current episode depressed, mild or moderate severity, unspecified: Secondary | ICD-10-CM

## 2015-12-22 DIAGNOSIS — F5000 Anorexia nervosa, unspecified: Secondary | ICD-10-CM | POA: Diagnosis not present

## 2015-12-22 DIAGNOSIS — F411 Generalized anxiety disorder: Secondary | ICD-10-CM | POA: Diagnosis not present

## 2015-12-22 MED ORDER — CLONAZEPAM 0.5 MG PO TABS: 0.5000 mg | ORAL_TABLET | Freq: Two times a day (BID) | ORAL | Status: DC

## 2015-12-22 NOTE — Progress Notes (Signed)
Patient ID: [name], female   DOB: 1971/05/14, 45 y.o.   MRN: [id number] [facility] [id number] Progress Note  [name] [id number] 45 y.o.  12/22/2015 4:16 PM  Chief Complaint: "I am constantly anxious "  History of Present Illness: Patient is a 45 year old Caucasian woman with a history of bipolar disorder and eating disorder for here for a followup appointment.  States depression is ok and she doesn't know if she is depressed.  She is no longer social and is no longer getting out with friends. States Wellbutrin is helping a lot. Denies low motivation and self esteem. Denies worthlessness and hopelessness. She is more active in the house and is no longer spending all her time in bed.  She has some crying spells. Pt is no longer working out daily.   Pt is sleeping about 6 hrs/night with Ambien. Energy and appetite are good.   Denies manic and hypomanic symptoms including periods of decreased need for sleep, increased energy, mood lability, impulsivity, FOI, and excessive spending.  States her eating disorder is under control. Appetite is good and weight is stable at 110lbs. Pt is eating more chocolate. Pt would like to lose a couple of lbs but she knows she is healthy at this weight. She is more comfortable at 105 lbs.   Panic attacks are happening constantly. It is taking over her life. Pt will not longer leave her house. States she doesn't like it and wants to stop it. She worries about her life, husband and dog are all about to die. Pt feel restless, muscle tension and on edge. Pt is having racing thoughts. States she can't handle it anymore.   Pt will start restart therapy soon.   Pt is taking meds as prescribed and denies SE.   Suicidal Ideation: No Plan Formed: No Patient has means to carry out plan: No  Homicidal Ideation: No Plan Formed: No Patient has means to carry out plan: No  Review of Systems: Psychiatric: Agitation: No Hallucination: No  Depressed  Mood: Yes Insomnia: No Hypersomnia: No Altered Concentration: No Feels Worthless: No Grandiose Ideas: No Belief In Special Powers: No New/Increased Substance Abuse: No Compulsions: No    Review of Systems  Constitutional: Negative for fever, chills and weight loss.  HENT: Negative for congestion, ear pain, nosebleeds, sore throat and tinnitus.   Eyes: Negative for blurred vision, double vision, pain and redness.  Respiratory: Negative for cough, sputum production and wheezing.   Cardiovascular: Positive for palpitations. Negative for chest pain and leg swelling.  Gastrointestinal: Positive for constipation. Negative for heartburn, nausea, vomiting and abdominal pain.  Musculoskeletal: Negative for back pain, joint pain and neck pain.  Skin: Negative for itching and rash.  Neurological: Positive for headaches. Negative for dizziness, sensory change, seizures and loss of consciousness.  Psychiatric/Behavioral: Positive for depression. Negative for suicidal ideas, hallucinations and substance abuse. The patient is nervous/anxious. The patient does not have insomnia.      Past Medical, Family, Social History:  Denies drug, alcohol and nicotine use. Pt is married and lives her with husband. Pt is unemployed and has not worked since 2012. She used to work at [employer] but was fired due to mania. She is long term disability since she was 45yo.  Reports she has been fired from several jobs due to her mania.    Family History  Problem Relation Age of Onset  . Schizophrenia Father   . Bipolar disorder Father   . Schizophrenia Paternal Grandmother   .  Suicidality Neg Hx    Past Medical History  Diagnosis Date  . Bipolar 1 disorder (HCC)   . Eating disorder   . Thyroid disease     Outpatient Encounter Prescriptions as of 12/22/2015  Medication Sig  . buPROPion (WELLBUTRIN XL) 150 MG 24 hr tablet Take 1 tablet (150 mg total) by mouth daily.  Marland Kitchen FLUoxetine (PROZAC) 20 MG capsule Take 3  capsules (60 mg total) by mouth daily.  . Norgestimate-Ethinyl Estradiol Triphasic (ORTHO TRI-CYCLEN LO) 0.18/0.215/0.25 MG-25 MCG tab Take 1 tablet by mouth daily.  Marland Kitchen topiramate (TOPAMAX) 100 MG tablet TAKE 1 BY MOUTH DAILY  . ziprasidone (GEODON) 60 MG capsule Take 1 capsule (60 mg total) by mouth at bedtime.  Marland Kitchen zolpidem (AMBIEN) 10 MG tablet Take 1 tablet (10 mg total) by mouth at bedtime as needed for sleep.  Marland Kitchen levothyroxine (SYNTHROID, LEVOTHROID) 25 MCG tablet Take 25 mcg by mouth daily before breakfast. Reported on 12/22/2015   No facility-administered encounter medications on file as of 12/22/2015.    Past Psychiatric History/Hospitalization(s): Anxiety: Yes Bipolar Disorder: Yes Depression: No Mania: No Psychosis: No Schizophrenia: No Personality Disorder: No Hospitalization for psychiatric illness: Yes last time in 2012 at [facility], reports hx of multiple previous hospitalizations.  History of Electroconvulsive Shock Therapy: No Prior Suicide Attempts: No  Physical Exam: Constitutional: Filed Vitals:   12/22/15 1612  Height: 5' 0.5" (1.537 m)  Weight: 114 lb 9.6 oz (51.982 kg)   Filed Vitals:   12/22/15 1612  BP: 122/86  Pulse: 84    General Appearance: alert, oriented, no acute distress  Musculoskeletal: Strength & Muscle Tone: within normal limits Gait & Station: normal Patient leans: straight.  Mental Status Examination/Evaluation: Objective: Attitude: Calm and cooperative  Appearance: Fairly Groomed, appears to be stated age  Eye Contact::  Good  Speech:  Clear and Coherent and Normal Rate  Volume:  Normal  Mood:  anxious  Affect:  Congruent  Thought Process:  Goal Directed, Linear and Logical  Orientation:  Full (Time, Place, and Person)  Thought Content:  Negative  Suicidal Thoughts:  No  Homicidal Thoughts:  No  Judgement:  Good  Insight:  Good  Concentration: good  Memory: Immediate-good Recent-good Remote-good  Recall: fair  Language: fair   Gait and Station: normal  [employer] of Knowledge: average  Psychomotor Activity:  Normal  Akathisia:  No  Handed:  Right  AIMS (if indicated):  Facial and Oral Movements  Muscles of Facial Expression: None, normal  Lips and Perioral Area: None, normal  Jaw: None, normal  Tongue: None, normal Extremity Movements: Upper (arms, wrists, hands, fingers): None, normal  Lower (legs, knees, ankles, toes): None, normal,  Trunk Movements:  Neck, shoulders, hips: None, normal,  Overall Severity : Severity of abnormal movements (highest score from questions above): None, normal  Incapacitation due to abnormal movements: None, normal  Patient's awareness of abnormal movements (rate only patient's report): No Awareness, Dental Status  Current problems with teeth and/or dentures?: No  Does patient usually wear dentures?: No    Assets:  Communication Skills Desire for Improvement Financial Resources/Insurance Housing Intimacy Leisure Time Physical Health Resilience Social Support Engineer, maintenance (IT) Decision Making (Choose Three): Established Problem, Stable/Improving (1), Review of Psycho-Social Stressors (1), Review or order clinical lab tests (1), Established Problem, Worsening (2), Review of Medication Regimen & Side Effects (2) and Review of New Medication or Change in Dosage (2)  Assessment: Axis I:  Bipolar disorder- current episode unspecified; GAD; Anorexia Nervosa; Insomnia  Axis II: deferred    Plan: 1. Wellbutrin XL  po qD. Pt is aware that it can cause seizures, especially in individuals with a hx of seizures. Reports last seizure was in 1997. Has tolerated Wellbutrin without SE and would like to continue taking it.  2. Prozac  po qD for mood  3. Geodon  po qD for mood lability 4. Ambien to  po qHS prn for sleep .  5. Topamax  po qHS for mood lability- pt started this medication in the  1997's for treatment of of seizures  6. Start trial of Klonopin 0.5mg  po BID prn anxiety  Medication management with supportive therapy. Risks/benefits and SE of the medication discussed. Pt verbalized understanding and verbal consent obtained for treatment.  Affirm with the patient that the medications are taken as ordered. Patient expressed understanding of how their medications were to be used.    Encouraged to continue therapy as soon as possible  Continue to monitor weight closely.  Labs: reviewed labs from PCP CBC WNL, CMP WNL, TSH WNL. Will scan into chart  reviewed EKG QTc 440, WNL  Therapy: brief supportive therapy provided. Discussed psychosocial stressors in detail.   Encouraged to continue therapy  Pt denies SI and is at an acute low risk for suicide.Patient told to call clinic if any problems occur. Patient advised to go to ER if they should develop SI/HI, side effects, or if symptoms worsen. Has crisis numbers to call if needed. Pt verbalized understanding.  F/up in 6 weeks or sooner if needed  Oletta Darter, MD 12/22/2015

## 2016-01-17 ENCOUNTER — Ambulatory Visit (HOSPITAL_COMMUNITY): Payer: Self-pay | Admitting: Psychiatry

## 2016-01-20 DIAGNOSIS — I788 Other diseases of capillaries: Secondary | ICD-10-CM | POA: Diagnosis not present

## 2016-01-20 DIAGNOSIS — L814 Other melanin hyperpigmentation: Secondary | ICD-10-CM | POA: Diagnosis not present

## 2016-02-07 ENCOUNTER — Encounter (HOSPITAL_COMMUNITY): Payer: Self-pay | Admitting: Psychiatry

## 2016-02-07 ENCOUNTER — Ambulatory Visit (INDEPENDENT_AMBULATORY_CARE_PROVIDER_SITE_OTHER): Payer: PPO | Admitting: Psychiatry

## 2016-02-07 DIAGNOSIS — G4700 Insomnia, unspecified: Secondary | ICD-10-CM

## 2016-02-07 DIAGNOSIS — F411 Generalized anxiety disorder: Secondary | ICD-10-CM

## 2016-02-07 DIAGNOSIS — F3130 Bipolar disorder, current episode depressed, mild or moderate severity, unspecified: Secondary | ICD-10-CM | POA: Diagnosis not present

## 2016-02-07 DIAGNOSIS — F5000 Anorexia nervosa, unspecified: Secondary | ICD-10-CM

## 2016-02-07 MED ORDER — TOPIRAMATE 100 MG PO TABS: ORAL_TABLET | ORAL | Status: DC

## 2016-02-07 MED ORDER — BUPROPION HCL ER (XL) 150 MG PO TB24: 150.0000 mg | ORAL_TABLET | Freq: Every day | ORAL | Status: DC

## 2016-02-07 MED ORDER — ZIPRASIDONE HCL 80 MG PO CAPS: 80.0000 mg | ORAL_CAPSULE | Freq: Every day | ORAL | Status: DC

## 2016-02-07 MED ORDER — CLONAZEPAM 1 MG PO TABS: 1.0000 mg | ORAL_TABLET | Freq: Two times a day (BID) | ORAL | Status: DC

## 2016-02-07 MED ORDER — FLUOXETINE HCL 20 MG PO CAPS: 60.0000 mg | ORAL_CAPSULE | Freq: Every day | ORAL | Status: DC

## 2016-02-07 NOTE — Progress Notes (Signed)
[facility] [id number] Progress Note  Annette Jahira Sanchez [id number] 45 y.o.  02/07/2016 11:04 AM  Chief Complaint: "I feel like I am stuck "  History of Present Illness: Patient is a 45 year old Caucasian woman with a history of bipolar disorder and eating disorder for here for a followup appointment.  States depression is ok and she doesn't know if she is depressed.  She is no longer social and is no longer getting out with friends. States Wellbutrin is helping a lot. Reports low motivation and anhedonia but self esteem is ok. Denies worthlessness and hopelessness. She is more active in the house and is no longer spending all her time in bed.  She has some crying spells. Pt is no longer working out daily. Pt states she is just sitting at home doing nothing. States she doesn't feel alive.   Pt is sleeping about 4-6 hrs/night with Ambien. Energy is low and appetite is good.   Denies manic and hypomanic symptoms including periods of decreased need for sleep, increased energy, mood lability, impulsivity, FOI, and excessive spending.  States her eating disorder is under control. Appetite is good and weight is stable at 114lbs. Pt is eating more chocolate. Pt would like to lose a couple of lbs but she knows she is healthy at this weight. She is more comfortable at 105 lbs.   Panic attacks are happening at night when she tries to sleep. Pt will take Klonopin 3x/week and it doesn't work.   Anxiety is high. She worries about her life, husband and dog are all about to die. Pt feel restless, muscle tension and on edge. Pt is having racing thoughts.   Pt has not been to therapy due to scheduling conflicts.   Pt is taking meds as prescribed and denies SE.   Suicidal Ideation: No Plan Formed: No Patient has means to carry out plan: No  Homicidal Ideation: No Plan Formed: No Patient has means to carry out plan: No  Review of Systems: Psychiatric: Agitation: No Hallucination: No  Depressed  Mood: Yes Insomnia: Yes Hypersomnia: No Altered Concentration: No Feels Worthless: No Grandiose Ideas: No Belief In Special Powers: No New/Increased Substance Abuse: No Compulsions: No    Review of Systems  Constitutional: Negative for fever, chills and weight loss.  HENT: Negative for congestion, ear pain, nosebleeds, sore throat and tinnitus.   Eyes: Negative for blurred vision, double vision, pain and redness.  Respiratory: Negative for cough, sputum production and wheezing.   Cardiovascular: Positive for palpitations. Negative for chest pain and leg swelling.  Gastrointestinal: Negative for heartburn, nausea, vomiting, abdominal pain and constipation.  Musculoskeletal: Negative for back pain, joint pain and neck pain.  Skin: Negative for itching and rash.  Neurological: Positive for headaches. Negative for dizziness, sensory change, seizures and loss of consciousness.  Psychiatric/Behavioral: Positive for depression. Negative for suicidal ideas, hallucinations and substance abuse. The patient is nervous/anxious and has insomnia.      Past Medical, Family, Social History:  Denies drug, alcohol and nicotine use. Pt is married and lives her with husband. Pt is unemployed and has not worked since 2012. She used to work at [employer] but was fired due to mania. She is long term disability since she was 45yo.  Reports she has been fired from several jobs due to her mania.    Family History  Problem Relation Age of Onset  . Schizophrenia Father   . Bipolar disorder Father   . Schizophrenia Paternal Grandmother   .  Suicidality Neg Hx    Past Medical History  Diagnosis Date  . Bipolar 1 disorder (HCC)   . Eating disorder   . Thyroid disease     Outpatient Encounter Prescriptions as of 02/07/2016  Medication Sig  . buPROPion (WELLBUTRIN XL) 150 MG 24 hr tablet Take 1 tablet (150 mg total) by mouth daily.  . clonazePAM (KLONOPIN) 0.5 MG tablet Take 1 tablet (0.5 mg total) by mouth 2  (two) times daily.  Marland Kitchen. FLUoxetine (PROZAC) 20 MG capsule Take 3 capsules (60 mg total) by mouth daily.  . Norgestimate-Ethinyl Estradiol Triphasic (ORTHO TRI-CYCLEN LO) 0.18/0.215/0.25 MG-25 MCG tab Take 1 tablet by mouth daily.  Marland Kitchen. topiramate (TOPAMAX) 100 MG tablet TAKE 1 BY MOUTH DAILY  . ziprasidone (GEODON) 60 MG capsule Take 1 capsule (60 mg total) by mouth at bedtime.  Marland Kitchen. zolpidem (AMBIEN) 10 MG tablet Take 1 tablet (10 mg total) by mouth at bedtime as needed for sleep.  Marland Kitchen. levothyroxine (SYNTHROID, LEVOTHROID) 25 MCG tablet Take 25 mcg by mouth daily before breakfast. Reported on 02/07/2016   No facility-administered encounter medications on file as of 02/07/2016.    Past Psychiatric History/Hospitalization(s): Anxiety: Yes Bipolar Disorder: Yes Depression: No Mania: No Psychosis: No Schizophrenia: No Personality Disorder: No Hospitalization for psychiatric illness: [facility], reports hx of multiple previous hospitalizations.  History of Electroconvulsive Shock Therapy: No Prior Suicide Attempts: No  Physical Exam: Constitutional: Filed Vitals:   02/07/16 1125  Height: 5' 0.5" (1.537 m)  Weight: 113 lb 3.2 oz (51.347 kg)   Filed Vitals:   02/07/16 1125  BP: 104/70  Pulse: 77    General Appearance: alert, oriented, no acute distress  Musculoskeletal: Strength & Muscle Tone: within normal limits Gait & Station: normal Patient leans: straight.  Mental Status Examination/Evaluation: Objective: Attitude: Calm and cooperative  Appearance: Fairly Groomed, appears to be stated age  Eye Contact::  Good  Speech:  Clear and Coherent and Normal Rate  Volume:  Normal  Mood:  Anxious and depressed  Affect:  Congruent  Thought Process:  Goal Directed, Linear and Logical  Orientation:  Full (Time, Place, and Person)  Thought Content:  Negative  Suicidal Thoughts:  No  Homicidal Thoughts:  No  Judgement:  Good  Insight:  Good  Concentration: good   Memory: Immediate-good Recent-good Remote-good  Recall: fair  Language: fair  Gait and Station: normal  [employer] of Knowledge: average  Psychomotor Activity:  Normal  Akathisia:  No  Handed:  Right  AIMS (if indicated):  Facial and Oral Movements  Muscles of Facial Expression: None, normal  Lips and Perioral Area: None, normal  Jaw: None, normal  Tongue: None, normal Extremity Movements: Upper (arms, wrists, hands, fingers): None, normal  Lower (legs, knees, ankles, toes): None, normal,  Trunk Movements:  Neck, shoulders, hips: None, normal,  Overall Severity : Severity of abnormal movements (highest score from questions above): None, normal  Incapacitation due to abnormal movements: None, normal  Patient's awareness of abnormal movements (rate only patient's report): No Awareness, Dental Status  Current problems with teeth and/or dentures?: No  Does patient usually wear dentures?: No    Assets:  Communication Skills Desire for Improvement Financial Resources/Insurance Housing Intimacy Leisure Time Physical Health Resilience Social Support Engineer, maintenance (IT)Talents/Skills Transportation Vocational/Educational         Medical Decision Making (Choose Three): Review of Psycho-Social Stressors (1), Established Problem, Worsening (2), Review of Medication Regimen & Side Effects (2) and Review of New  Medication or Change in Dosage (2)  Assessment: Axis I: Bipolar disorder- current episode unspecified; GAD; Anorexia Nervosa; Insomnia  Axis II: deferred    Plan: 1. Wellbutrin XL  po qD. Pt is aware that it can cause seizures, especially in individuals with a hx of seizures. Reports last seizure was in 1997. Has tolerated Wellbutrin without SE and would like to continue taking it.  2. Prozac  po qD for mood  3. Increase Geodon to  po qD for mood lability 4. D/c Ambien   5. Topamax  po qHS for mood lability- pt started this medication in the 1997's for  treatment of of seizures  6. Increase Klonopin  po BID prn anxiety  Medication management with supportive therapy. Risks/benefits and SE of the medication discussed. Pt verbalized understanding and verbal consent obtained for treatment.  Affirm with the patient that the medications are taken as ordered. Patient expressed understanding of how their medications were to be used.    Encouraged to continue therapy as soon as possible  Continue to monitor weight closely.  Labs: reviewed labs from PCP CBC WNL, CMP WNL, TSH WNL. Will scan into chart  reviewed 11/29/2015 EKG QTc 440, WNL  Therapy: brief supportive therapy provided. Discussed psychosocial stressors in detail.   Encouraged to continue therapy  Pt denies SI and is at an acute low risk for suicide.Patient told to call clinic if any problems occur. Patient advised to go to ER if they should develop SI/HI, side effects, or if symptoms worsen. Has crisis numbers to call if needed. Pt verbalized understanding.  F/up in 6 weeks or sooner if needed  Oletta Darter, MD 02/07/2016

## 2016-04-12 ENCOUNTER — Ambulatory Visit (INDEPENDENT_AMBULATORY_CARE_PROVIDER_SITE_OTHER): Payer: PPO | Admitting: Psychiatry

## 2016-04-12 ENCOUNTER — Encounter (HOSPITAL_COMMUNITY): Payer: Self-pay | Admitting: Psychiatry

## 2016-04-12 VITALS — BP 113/75 | HR 78 | Ht 60.5 in | Wt 108.2 lb

## 2016-04-12 DIAGNOSIS — F411 Generalized anxiety disorder: Secondary | ICD-10-CM

## 2016-04-12 DIAGNOSIS — F3130 Bipolar disorder, current episode depressed, mild or moderate severity, unspecified: Secondary | ICD-10-CM

## 2016-04-12 DIAGNOSIS — G4700 Insomnia, unspecified: Secondary | ICD-10-CM | POA: Diagnosis not present

## 2016-04-12 DIAGNOSIS — F5000 Anorexia nervosa, unspecified: Secondary | ICD-10-CM | POA: Diagnosis not present

## 2016-04-12 MED ORDER — TOPIRAMATE 100 MG PO TABS: ORAL_TABLET | ORAL | Status: DC

## 2016-04-12 MED ORDER — CLONAZEPAM 1 MG PO TABS: 1.0000 mg | ORAL_TABLET | Freq: Two times a day (BID) | ORAL | Status: DC

## 2016-04-12 MED ORDER — ZIPRASIDONE HCL 60 MG PO CAPS: 60.0000 mg | ORAL_CAPSULE | Freq: Every day | ORAL | Status: DC

## 2016-04-12 MED ORDER — BUPROPION HCL ER (XL) 150 MG PO TB24: 450.0000 mg | ORAL_TABLET | Freq: Every day | ORAL | Status: DC

## 2016-04-12 MED ORDER — FLUOXETINE HCL 20 MG PO CAPS: 60.0000 mg | ORAL_CAPSULE | Freq: Every day | ORAL | Status: DC

## 2016-04-12 NOTE — Progress Notes (Signed)
Patient ID: [name], female   DOB: [birth year], 45 y.o.   MRN: [id number] [facility] [id number] Progress Note  [name] [id number] 45 y.o.  04/12/2016 2:20 PM  Chief Complaint: "I feel like I am stuck "  History of Present Illness: Patient is a 45 year old Caucasian woman with a history of bipolar disorder and eating disorder for here for a followup appointment.  States depression is worse. She is no longer social and is no longer getting out with friends. She increased Wellbutrin to 300mg . Reports low motivation and anhedonia but self esteem is ok. Reports worthlessness and hopelessness. She is no longer active in the house and is  spending all her time in bed.  She has some crying spells.  Pt states she is just sitting at home doing nothing. States she doesn't feel alive. Pt feels like time is running out. 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  Pt is sleeping about 4-6 hrs/night with Klonopin 1mg  and Melatonin. Energy is low and appetite is good.   Denies manic and hypomanic symptoms including periods of decreased need for sleep, increased energy, mood lability, impulsivity, FOI, and excessive spending.  States her eating disorder is worse. She is drinking 2 diet shakes a day and some days she will have cereal.   Panic attacks are no longer happening.  Anxiety is decreased and she feels numb. She worries about her life, husband and dog are all about to die. Pt feel restless, muscle tension and on edge. Pt is having racing thoughts.   Pt has not been to therapy due to scheduling conflicts.   Pt is taking meds as prescribed and denies SE.   Suicidal Ideation: No Plan Formed: No Patient has means to carry out plan: No  Homicidal Ideation: No Plan Formed: No Patient has means to carry out plan: No  Review of Systems: Psychiatric: Agitation: No Hallucination: No  Depressed Mood: Yes Insomnia: Yes Hypersomnia: No Altered Concentration: No Feels  Worthless: No Grandiose Ideas: No Belief In Special Powers: No New/Increased Substance Abuse: No Compulsions: No    Review of Systems  Constitutional: Negative for fever, chills and weight loss.  HENT: Negative for congestion, ear pain, nosebleeds, sore throat and tinnitus.   Eyes: Negative for blurred vision, double vision, pain and redness.  Respiratory: Negative for cough, sputum production and wheezing.   Cardiovascular: Positive for palpitations. Negative for chest pain and leg swelling.  Gastrointestinal: Negative for heartburn, nausea, vomiting, abdominal pain and constipation.  Musculoskeletal: Negative for back pain, joint pain and neck pain.  Skin: Negative for itching and rash.  Neurological: Positive for headaches. Negative for dizziness, sensory change, seizures and loss of consciousness.  Psychiatric/Behavioral: Positive for depression. Negative for suicidal ideas, hallucinations and substance abuse. The patient is nervous/anxious and has insomnia.      Past Medical, Family, Social History:  Denies drug, alcohol and nicotine use. Pt is married and lives her with husband. Pt is unemployed and has not worked since 2012. She used to work at [employer] but was fired due to mania. She is long term disability since she was 45yo.  Reports she has been fired from several jobs due to her mania.    Family History  Problem Relation Age of Onset  . Schizophrenia Father   . Bipolar disorder Father   . Schizophrenia Paternal Grandmother   . Suicidality Neg Hx    Past Medical History  Diagnosis Date  . Bipolar 1 disorder (  HCC)   . Eating disorder   . Thyroid disease     Outpatient Encounter Prescriptions as of 04/12/2016  Medication Sig  . buPROPion (WELLBUTRIN XL) 150 MG 24 hr tablet Take 1 tablet (150 mg total) by mouth daily. (Patient taking differently: Take 300 mg by mouth daily. )  . clonazePAM (KLONOPIN) 1 MG tablet Take 1 tablet (1 mg total) by mouth 2 (two) times daily.  Marland Kitchen.  FLUoxetine (PROZAC) 20 MG capsule Take 3 capsules (60 mg total) by mouth daily.  Marland Kitchen. levothyroxine (SYNTHROID, LEVOTHROID) 25 MCG tablet Take 25 mcg by mouth daily before breakfast. Reported on 02/07/2016  . Norgestimate-Ethinyl Estradiol Triphasic (ORTHO TRI-CYCLEN LO) 0.18/0.215/0.25 MG-25 MCG tab Take 1 tablet by mouth daily.  Marland Kitchen. topiramate (TOPAMAX) 100 MG tablet TAKE 1 BY MOUTH DAILY  . ziprasidone (GEODON) 80 MG capsule Take 1 capsule (80 mg total) by mouth at bedtime. (Patient taking differently: Take 60 mg by mouth at bedtime. )   No facility-administered encounter medications on file as of 04/12/2016.    Past Psychiatric History/Hospitalization(s): Anxiety: Yes Bipolar Disorder: Yes Depression: No Mania: No Psychosis: No Schizophrenia: No Personality Disorder: No Hospitalization for psychiatric illness: [facility], reports hx of multiple previous hospitalizations.  History of Electroconvulsive Shock Therapy: No Prior Suicide Attempts: No  Physical Exam: Constitutional: Filed Vitals:   04/12/16 1438  Height: 5' 0.5" (1.537 m)  Weight: 108 lb 3.2 oz (49.079 kg)   Filed Vitals:   04/12/16 1438  BP: 113/75  Pulse: 78    General Appearance: alert, oriented, no acute distress  Musculoskeletal: Strength & Muscle Tone: within normal limits Gait & Station: normal Patient leans: straight.  Mental Status Examination/Evaluation: Objective: Attitude: Calm and cooperative  Appearance: Fairly Groomed, appears to be stated age  Eye Contact::  Good  Speech:  Clear and Coherent and Normal Rate  Volume:  Normal  Mood:  Anxious and depressed  Affect:  Congruent  Thought Process:  Goal Directed, Linear and Logical  Orientation:  Full (Time, Place, and Person)  Thought Content:  Negative  Suicidal Thoughts:  No  Homicidal Thoughts:  No  Judgement:  Good  Insight:  Good  Concentration: good  Memory: Immediate-good Recent-good Remote-good  Recall: fair   Language: fair  Gait and Station: normal  [employer] of Knowledge: average  Psychomotor Activity:  Normal  Akathisia:  No  Handed:  Right  AIMS (if indicated):  Facial and Oral Movements  Muscles of Facial Expression: None, normal  Lips and Perioral Area: None, normal  Jaw: None, normal  Tongue: None, normal Extremity Movements: Upper (arms, wrists, hands, fingers): None, normal  Lower (legs, knees, ankles, toes): None, normal,  Trunk Movements:  Neck, shoulders, hips: None, normal,  Overall Severity : Severity of abnormal movements (highest score from questions above): None, normal  Incapacitation due to abnormal movements: None, normal  Patient's awareness of abnormal movements (rate only patient's report): No Awareness, Dental Status  Current problems with teeth and/or dentures?: No  Does patient usually wear dentures?: No    Assets:  Communication Skills Desire for Improvement Financial Resources/Insurance Housing Intimacy Leisure Time Physical Health Resilience Social Support NeurosurgeonTalents/Skills Transportation Vocational/Educational         Medical Decision Making (Choose Three): Review of Psycho-Social Stressors (1), Established Problem, Worsening (2), Review of Medication Regimen & Side Effects (2) and Review of New Medication or Change in Dosage (2)  Assessment: Axis I: Bipolar disorder- current episode unspecified; GAD; Anorexia  Nervosa; Insomnia  Axis II: deferred    Plan: 1. increase Wellbutrin XL  po qD. Pt is aware that it can cause seizures, especially in individuals with a hx of seizures. Reports last seizure was in 1997. Has tolerated Wellbutrin without SE and would like to continue taking it.  2. Prozac  po qD for mood  3. decrease Geodon to  po qD for mood lability 4. Continue Melatonin   5. Topamax  po qHS for mood lability- pt started this medication in the 1997's for treatment of of seizures  6. Klonopin  po BID prn  anxiety  Medication management with supportive therapy. Risks/benefits and SE of the medication discussed. Pt verbalized understanding and verbal consent obtained for treatment.  Affirm with the patient that the medications are taken as ordered. Patient expressed understanding of how their medications were to be used.    Continue to monitor weight closely.  Labs: reviewed labs from PCP CBC WNL, CMP WNL, TSH WNL. Will scan into chart  reviewed 11/29/2015 EKG QTc 440, WNL  Therapy: brief supportive therapy provided. Discussed psychosocial stressors in detail.   Encouraged to continue therapy Pt will consider IOP  Pt denies SI and is at an acute low risk for suicide.Patient told to call clinic if any problems occur. Patient advised to go to ER if they should develop SI/HI, side effects, or if symptoms worsen. Has crisis numbers to call if needed. Pt verbalized understanding.  F/up in 4 weeks or sooner if needed  Oletta Darter, MD 04/12/2016

## 2016-05-10 ENCOUNTER — Ambulatory Visit (HOSPITAL_COMMUNITY): Payer: Self-pay | Admitting: Psychiatry

## 2016-05-15 ENCOUNTER — Ambulatory Visit (HOSPITAL_COMMUNITY): Payer: Self-pay | Admitting: Psychiatry

## 2016-05-15 ENCOUNTER — Ambulatory Visit (INDEPENDENT_AMBULATORY_CARE_PROVIDER_SITE_OTHER): Payer: PPO | Admitting: Psychiatry

## 2016-05-15 ENCOUNTER — Encounter (HOSPITAL_COMMUNITY): Payer: Self-pay | Admitting: Psychiatry

## 2016-05-15 VITALS — BP 98/64 | HR 68 | Ht 60.5 in | Wt 106.0 lb

## 2016-05-15 DIAGNOSIS — G4700 Insomnia, unspecified: Secondary | ICD-10-CM | POA: Diagnosis not present

## 2016-05-15 DIAGNOSIS — F5000 Anorexia nervosa, unspecified: Secondary | ICD-10-CM

## 2016-05-15 DIAGNOSIS — F411 Generalized anxiety disorder: Secondary | ICD-10-CM | POA: Diagnosis not present

## 2016-05-15 DIAGNOSIS — F319 Bipolar disorder, unspecified: Secondary | ICD-10-CM

## 2016-05-15 MED ORDER — CLONAZEPAM 1 MG PO TABS: 1.0000 mg | ORAL_TABLET | Freq: Two times a day (BID) | ORAL | Status: DC

## 2016-05-15 NOTE — Progress Notes (Signed)
Patient ID: Ayen Viviano, female   DOB: June 12, 1971, 45 y.o.   MRN: [id number] Patient ID: Winter Jocelyn, female   DOB: 10-06-1971, 45 y.o.   MRN: [id number] [facility] [id number] Progress Note  Cristie Ahnyla Mendel [id number] 45 y.o.  05/15/2016 2:10 PM  Chief Complaint: "I am better "  History of Present Illness: Patient is a 46 year old Caucasian woman with a history of bipolar disorder and eating disorder for here for a followup appointment.  States depression is improving. She is making an effort to be more social and is looking for volunteer work.  Reports low motivation and anhedonia worthlessness and hopelessness are improving. She is active in the house and is no longer spending all her time in bed.  She is no longer crying.   Pt is sleeping about 6 hrs/night with Klonopin 1mg  and Melatonin. Energy is low and appetite is good.   Denies manic and hypomanic symptoms including periods of decreased need for sleep, increased energy, mood lability, impulsivity, FOI, and excessive spending.  States her eating disorder is much better.   Panic attacks are no longer happening.  Anxiety is decreased and no longer has a feeling of dread. Pt is taking Klonopin 1mg  twice a day and it helps a lot.   Pt has not been to therapy due to scheduling conflicts. She is looking for groups.   Pt is taking meds as prescribed and denies SE.   Suicidal Ideation: No Plan Formed: No Patient has means to carry out plan: No  Homicidal Ideation: No Plan Formed: No Patient has means to carry out plan: No  Review of Systems: Psychiatric: Agitation: No Hallucination: No  Depressed Mood: Yes Insomnia: No Hypersomnia: No Altered Concentration: No Feels Worthless: No Grandiose Ideas: No Belief In Special Powers: No New/Increased Substance Abuse: No Compulsions: No    Review of Systems  Constitutional: Negative for fever, chills and weight loss.  HENT: Negative for congestion, ear pain,  nosebleeds, sore throat and tinnitus.   Eyes: Negative for blurred vision, double vision, pain and redness.  Respiratory: Negative for cough, sputum production and wheezing.   Cardiovascular: Positive for palpitations. Negative for chest pain and leg swelling.  Gastrointestinal: Negative for heartburn, nausea, vomiting, abdominal pain and constipation.  Musculoskeletal: Negative for back pain, joint pain and neck pain.  Skin: Negative for itching and rash.  Neurological: Positive for headaches. Negative for dizziness, sensory change, seizures and loss of consciousness.  Psychiatric/Behavioral: Positive for depression. Negative for suicidal ideas, hallucinations and substance abuse. The patient is nervous/anxious and has insomnia.      Past Medical, Family, Social History:  Denies drug, alcohol and nicotine use. Pt is married and lives her with husband. Pt is unemployed and has not worked since 2012. She used to work at [employer] but was fired due to mania. She is long term disability since she was 45yo.  Reports she has been fired from several jobs due to her mania.    Family History  Problem Relation Age of Onset  . Schizophrenia Father   . Bipolar disorder Father   . Schizophrenia Paternal Grandmother   . Suicidality Neg Hx    Past Medical History  Diagnosis Date  . Bipolar 1 disorder (HCC)   . Eating disorder   . Thyroid disease     Outpatient Encounter Prescriptions as of 05/15/2016  Medication Sig  . buPROPion (WELLBUTRIN XL) 150 MG 24 hr tablet Take 3 tablets (450 mg total) by mouth daily.  Marland Kitchen  clonazePAM (KLONOPIN) 1 MG tablet Take 1 tablet (1 mg total) by mouth 2 (two) times daily.  Marland Kitchen. FLUoxetine (PROZAC) 20 MG capsule Take 3 capsules (60 mg total) by mouth daily.  Marland Kitchen. levothyroxine (SYNTHROID, LEVOTHROID) 25 MCG tablet Take 25 mcg by mouth daily before breakfast. Reported on 02/07/2016  . Norgestimate-Ethinyl Estradiol Triphasic (ORTHO TRI-CYCLEN LO) 0.18/0.215/0.25 MG-25 MCG tab Take  1 tablet by mouth daily.  Marland Kitchen. topiramate (TOPAMAX) 100 MG tablet TAKE 1 BY MOUTH DAILY  . ziprasidone (GEODON) 60 MG capsule Take 1 capsule (60 mg total) by mouth at bedtime.   No facility-administered encounter medications on file as of 05/15/2016.    Past Psychiatric History/Hospitalization(s): Anxiety: Yes Bipolar Disorder: Yes Depression: No Mania: No Psychosis: No Schizophrenia: No Personality Disorder: No Hospitalization for psychiatric illness: Yes [facility], reports hx of multiple previous hospitalizations.  History of Electroconvulsive Shock Therapy: No Prior Suicide Attempts: No  Physical Exam: Constitutional: Filed Vitals:   05/15/16 1402  Height: 5' 0.5" (1.537 m)  Weight: 106 lb (48.081 kg)   Filed Vitals:   05/15/16 1402  BP: 98/64  Pulse: 68    General Appearance: alert, oriented, no acute distress  Musculoskeletal: Strength & Muscle Tone: within normal limits Gait & Station: normal Patient leans: straight.  Mental Status Examination/Evaluation: Objective: Attitude: Calm and cooperative  Appearance: Fairly Groomed, appears to be stated age  Eye Contact::  Good  Speech:  Clear and Coherent and Normal Rate  Volume:  Normal  Mood:  Anxious and depressed  Affect:  Congruent- much brighter and calmer than previous appt  Thought Process:  Goal Directed, Linear and Logical  Orientation:  Full (Time, Place, and Person)  Thought Content:  Negative  Suicidal Thoughts:  No  Homicidal Thoughts:  No  Judgement:  Good  Insight:  Good  Concentration: good  Memory: Immediate-good Recent-good Remote-good  Recall: fair  Language: fair  Gait and Station: normal  [employer] of Knowledge: average  Psychomotor Activity:  Normal  Akathisia:  No  Handed:  Right  AIMS (if indicated):  Facial and Oral Movements  Muscles of Facial Expression: None, normal  Lips and Perioral Area: None, normal  Jaw: None, normal  Tongue: None, normal Extremity  Movements: Upper (arms, wrists, hands, fingers): None, normal  Lower (legs, knees, ankles, toes): None, normal,  Trunk Movements:  Neck, shoulders, hips: None, normal,  Overall Severity : Severity of abnormal movements (highest score from questions above): None, normal  Incapacitation due to abnormal movements: None, normal  Patient's awareness of abnormal movements (rate only patient's report): No Awareness, Dental Status  Current problems with teeth and/or dentures?: No  Does patient usually wear dentures?: No    Assets:  Communication Skills Desire for Improvement Financial Resources/Insurance Housing Intimacy Leisure Time Physical Health Resilience Social Support NeurosurgeonTalents/Skills Transportation Vocational/Educational         Medical Decision Making (Choose Three): Review of Psycho-Social Stressors (1), Established Problem, Worsening (2), Review of Medication Regimen & Side Effects (2) and Review of New Medication or Change in Dosage (2)  Assessment: Axis I: Bipolar disorder- current episode unspecified; GAD; Anorexia Nervosa; Insomnia  Axis II: deferred    Plan: 1. Wellbutrin XL 450mg  po qD. Pt is aware that it can cause seizures, especially in individuals with a hx of seizures. Reports last seizure was in 1997. Has tolerated Wellbutrin without SE and would like to continue taking it.  2. Prozac 60mg  po qD for mood  3. Geodon  to  po qD for mood lability 4. Continue Melatonin   5. Topamax  po qHS for mood lability- pt started this medication in the 1997's for treatment of of seizures  6. Klonopin  po BID prn anxiety  Medication management with supportive therapy. Risks/benefits and SE of the medication discussed. Pt verbalized understanding and verbal consent obtained for treatment.  Affirm with the patient that the medications are taken as ordered. Patient expressed understanding of how their medications were to be used.    Continue to monitor weight  closely.  Labs: reviewed labs from PCP CBC WNL, CMP WNL, TSH WNL. Will scan into chart  reviewed 11/29/2015 EKG QTc 440, WNL  Therapy: brief supportive therapy provided. Discussed psychosocial stressors in detail.   Encouraged to continue therapy Pt was given info for [facility]  Pt denies SI and is at an acute low risk for suicide.Patient told to call clinic if any problems occur. Patient advised to go to ER if they should develop SI/HI, side effects, or if symptoms worsen. Has crisis numbers to call if needed. Pt verbalized understanding.  F/up in 8 weeks or sooner if needed  Oletta Darter, MD 05/15/2016

## 2016-05-15 NOTE — Addendum Note (Signed)
Addended by: Oletta DarterAGARWAL, Jeniyah Menor on: 05/15/2016 02:23 PM   Modules accepted: Orders

## 2016-05-30 ENCOUNTER — Other Ambulatory Visit: Payer: Self-pay | Admitting: Physician Assistant

## 2016-05-30 ENCOUNTER — Other Ambulatory Visit (HOSPITAL_COMMUNITY)
Admission: RE | Admit: 2016-05-30 | Discharge: 2016-05-30 | Disposition: A | Discharge status: Home / Self Care | Payer: PPO | Source: Ambulatory Visit | Attending: Physician Assistant | Admitting: Physician Assistant

## 2016-05-30 DIAGNOSIS — N3944 Nocturnal enuresis: Secondary | ICD-10-CM | POA: Diagnosis not present

## 2016-05-30 DIAGNOSIS — Z01419 Encounter for gynecological examination (general) (routine) without abnormal findings: Secondary | ICD-10-CM | POA: Diagnosis not present

## 2016-05-30 DIAGNOSIS — Z1151 Encounter for screening for human papillomavirus (HPV): Secondary | ICD-10-CM | POA: Diagnosis not present

## 2016-05-30 DIAGNOSIS — Z124 Encounter for screening for malignant neoplasm of cervix: Secondary | ICD-10-CM | POA: Diagnosis not present

## 2016-05-30 DIAGNOSIS — Z0000 Encounter for general adult medical examination without abnormal findings: Secondary | ICD-10-CM | POA: Diagnosis not present

## 2016-05-30 DIAGNOSIS — E785 Hyperlipidemia, unspecified: Secondary | ICD-10-CM | POA: Diagnosis not present

## 2016-05-30 DIAGNOSIS — F319 Bipolar disorder, unspecified: Secondary | ICD-10-CM | POA: Diagnosis not present

## 2016-05-30 DIAGNOSIS — F5000 Anorexia nervosa, unspecified: Secondary | ICD-10-CM | POA: Diagnosis not present

## 2016-05-31 ENCOUNTER — Other Ambulatory Visit: Payer: Self-pay | Admitting: Family Medicine

## 2016-05-31 DIAGNOSIS — Z1231 Encounter for screening mammogram for malignant neoplasm of breast: Secondary | ICD-10-CM

## 2016-06-01 LAB — CYTOLOGY - PAP

## 2016-06-19 ENCOUNTER — Telehealth (HOSPITAL_COMMUNITY): Payer: Self-pay

## 2016-06-19 DIAGNOSIS — F5104 Psychophysiologic insomnia: Secondary | ICD-10-CM

## 2016-06-19 NOTE — Telephone Encounter (Signed)
patient is calling because she needs something to help her sleep. She is currently on Klonopin 1 po bid, but she said that the klonopin really does not help her sleep and she would rather save that for when she is having bad anxiety. Patient states she was on trazodone before at 100 mg, she said that dose was too high, but would like to know if she can try a smaller dose. Please review and advise, thank you

## 2016-06-20 ENCOUNTER — Ambulatory Visit: Payer: Self-pay

## 2016-06-21 MED ORDER — TRAZODONE HCL 50 MG PO TABS: 50.0000 mg | ORAL_TABLET | Freq: Every day | ORAL | 1 refills | Status: DC

## 2016-06-21 NOTE — Telephone Encounter (Signed)
I called the patient after I spoke with Dr. Michae Kava. I let her know that an order for the Trazodone 50 mg was sent to the pharmacy for her. Patient voiced her understanding and was very appreciative.

## 2016-07-19 ENCOUNTER — Other Ambulatory Visit (HOSPITAL_COMMUNITY): Payer: Self-pay

## 2016-07-19 ENCOUNTER — Other Ambulatory Visit (HOSPITAL_COMMUNITY): Payer: Self-pay | Admitting: Psychiatry

## 2016-07-19 DIAGNOSIS — F3130 Bipolar disorder, current episode depressed, mild or moderate severity, unspecified: Secondary | ICD-10-CM

## 2016-07-19 DIAGNOSIS — F411 Generalized anxiety disorder: Secondary | ICD-10-CM

## 2016-07-19 DIAGNOSIS — G4700 Insomnia, unspecified: Secondary | ICD-10-CM

## 2016-07-19 MED ORDER — BUPROPION HCL ER (XL) 150 MG PO TB24: 450.0000 mg | ORAL_TABLET | Freq: Every day | ORAL | 0 refills | Status: DC

## 2016-07-19 MED ORDER — FLUOXETINE HCL 20 MG PO CAPS: 60.0000 mg | ORAL_CAPSULE | Freq: Every day | ORAL | 0 refills | Status: DC

## 2016-07-19 MED ORDER — TOPIRAMATE 100 MG PO TABS: ORAL_TABLET | ORAL | 0 refills | Status: DC

## 2016-07-19 MED ORDER — ZIPRASIDONE HCL 60 MG PO CAPS: 60.0000 mg | ORAL_CAPSULE | Freq: Every day | ORAL | 0 refills | Status: DC

## 2016-07-19 MED ORDER — CLONAZEPAM 1 MG PO TABS: 1.0000 mg | ORAL_TABLET | Freq: Two times a day (BID) | ORAL | 0 refills | Status: DC

## 2016-07-19 NOTE — Progress Notes (Signed)
Patient called today for refills on her medications, she has a follow up next month, but will be out of medication. Per protocol, I sent in one month supply of her medications.

## 2016-07-26 ENCOUNTER — Other Ambulatory Visit (HOSPITAL_COMMUNITY): Payer: Self-pay

## 2016-07-26 DIAGNOSIS — F411 Generalized anxiety disorder: Secondary | ICD-10-CM

## 2016-07-26 DIAGNOSIS — G4700 Insomnia, unspecified: Secondary | ICD-10-CM

## 2016-07-26 MED ORDER — CLONAZEPAM 1 MG PO TABS: 1.0000 mg | ORAL_TABLET | Freq: Two times a day (BID) | ORAL | 0 refills | Status: DC

## 2016-07-26 NOTE — Telephone Encounter (Signed)
Refill enough to get to scheduled appt on 08/02/2016

## 2016-07-26 NOTE — Progress Notes (Signed)
Per Dr. Michae KavaAgarwal, I called in enough Klonopin to get patient to appointment on 9/7 - #16 tabs 1 po bid -

## 2016-08-02 ENCOUNTER — Ambulatory Visit (INDEPENDENT_AMBULATORY_CARE_PROVIDER_SITE_OTHER): Payer: PPO | Admitting: Psychiatry

## 2016-08-02 ENCOUNTER — Encounter (HOSPITAL_COMMUNITY): Payer: Self-pay | Admitting: Psychiatry

## 2016-08-02 DIAGNOSIS — F411 Generalized anxiety disorder: Secondary | ICD-10-CM | POA: Diagnosis not present

## 2016-08-02 DIAGNOSIS — G4700 Insomnia, unspecified: Secondary | ICD-10-CM

## 2016-08-02 DIAGNOSIS — F3130 Bipolar disorder, current episode depressed, mild or moderate severity, unspecified: Secondary | ICD-10-CM

## 2016-08-02 MED ORDER — BUPROPION HCL ER (XL) 150 MG PO TB24: 450.0000 mg | ORAL_TABLET | Freq: Every day | ORAL | 0 refills | Status: DC

## 2016-08-02 MED ORDER — ZIPRASIDONE HCL 60 MG PO CAPS: 60.0000 mg | ORAL_CAPSULE | Freq: Every day | ORAL | 0 refills | Status: DC

## 2016-08-02 MED ORDER — CLONAZEPAM 1 MG PO TABS: 1.0000 mg | ORAL_TABLET | Freq: Two times a day (BID) | ORAL | 0 refills | Status: DC

## 2016-08-02 MED ORDER — FLUOXETINE HCL 40 MG PO CAPS: 80.0000 mg | ORAL_CAPSULE | Freq: Every day | ORAL | 0 refills | Status: DC

## 2016-08-02 MED ORDER — TOPIRAMATE 100 MG PO TABS: ORAL_TABLET | ORAL | 0 refills | Status: DC

## 2016-08-02 NOTE — Progress Notes (Signed)
Patient ID: [name], female   DOB: 1971-06-06, 45 y.o.   MRN: [id number] Patient ID: [name], female   DOB: 1971-06-06, 45 y.o.   MRN: [id number] [facility] [id number] Progress Note  [name] [id number] 45 y.o.  08/02/2016 2:36 PM  Chief Complaint: "I am better "  History of Present Illness: Patient is a 45 year old Caucasian woman with a history of bipolar disorder and eating disorder for here for a followup appointment.  Her family has suffered 2 major losses in the last month.   States depression is worsening. She is no longer making an effort to be social.  Reports low motivation and anhedonia worthlessness and hopelessness are worsening. She is no longer active in the house and is having crying. States she thinks this is due to family stressors. Pt is concerned as she traditionally has depression in the fall and winter.   Pt is sleeping about 6 hrs/night with Klonopin 1mg  and Melatonin. Energy is low and appetite is good.   Denies manic and hypomanic symptoms including periods of decreased need for sleep, increased energy, mood lability, impulsivity, FOI, and excessive spending.  States her eating disorder is much better.   Panic attacks are no longer happening.  Anxiety is decreased and no longer has a feeling of dread. Pt is taking Klonopin 1mg  twice a day and it helps a lot.   Pt has not been to therapy due to scheduling conflicts. She is looking for groups.   Pt is taking meds as prescribed and denies SE.   Suicidal Ideation: No Plan Formed: No Patient has means to carry out plan: No  Homicidal Ideation: No Plan Formed: No Patient has means to carry out plan: No  Review of Systems: Psychiatric: Agitation: No Hallucination: No  Depressed Mood: Yes Insomnia: No Hypersomnia: No Altered Concentration: No Feels Worthless: No Grandiose Ideas: No Belief In Special Powers: No New/Increased Substance Abuse: No Compulsions: No    Review of  Systems  Constitutional: Negative for chills, fever and weight loss.  HENT: Negative for congestion, ear pain, nosebleeds, sore throat and tinnitus.   Eyes: Negative for blurred vision, double vision, pain and redness.  Respiratory: Negative for cough, sputum production and wheezing.   Cardiovascular: Positive for palpitations. Negative for chest pain and leg swelling.  Gastrointestinal: Negative for abdominal pain, constipation, heartburn, nausea and vomiting.  Musculoskeletal: Negative for back pain, joint pain and neck pain.  Skin: Negative for itching and rash.  Neurological: Positive for headaches. Negative for dizziness, sensory change, seizures and loss of consciousness.  Psychiatric/Behavioral: Positive for depression. Negative for hallucinations, substance abuse and suicidal ideas. The patient is nervous/anxious. The patient does not have insomnia.      Past Medical, Family, Social History:  Denies drug, alcohol and nicotine use. Pt is married and lives her with husband. Pt is unemployed and has not worked since 2012. She used to work at [employer] but was fired due to mania. She is long term disability since she was 45yo.  Reports she has been fired from several jobs due to her mania.    Family History  Problem Relation Age of Onset  . Schizophrenia Father   . Bipolar disorder Father   . Schizophrenia Paternal Grandmother   . Suicidality Neg Hx    Past Medical History:  Diagnosis Date  . Bipolar 1 disorder (HCC)   . Eating disorder   . Thyroid disease     Outpatient Encounter Prescriptions as of 08/02/2016  Medication Sig  . buPROPion (WELLBUTRIN XL) 150 MG 24 hr tablet Take 3 tablets (450 mg total) by mouth daily.  . clonazePAM (KLONOPIN) 1 MG tablet Take 1 tablet (1 mg total) by mouth 2 (two) times daily.  Marland Kitchen FLUoxetine (PROZAC) 20 MG capsule Take 3 capsules (60 mg total) by mouth daily.  Marland Kitchen levothyroxine (SYNTHROID, LEVOTHROID) 25 MCG tablet Take 25 mcg by mouth daily before  breakfast. Reported on 02/07/2016  . Melatonin 10 MG TBDP Take by mouth.  . Norgestimate-Ethinyl Estradiol Triphasic (ORTHO TRI-CYCLEN LO) 0.18/0.215/0.25 MG-25 MCG tab Take 1 tablet by mouth daily.  Marland Kitchen topiramate (TOPAMAX) 100 MG tablet TAKE 1 BY MOUTH DAILY  . ziprasidone (GEODON) 60 MG capsule Take 1 capsule (60 mg total) by mouth at bedtime.  . [DISCONTINUED] traZODone (DESYREL) 50 MG tablet Take 1 tablet (50 mg total) by mouth at bedtime.   No facility-administered encounter medications on file as of 08/02/2016.     Past Psychiatric History/Hospitalization(s): Anxiety: Yes Bipolar Disorder: Yes Depression: No Mania: No Psychosis: No Schizophrenia: No Personality Disorder: No Hospitalization for psychiatric illness: Yes last time in 2012 at [facility], reports hx of multiple previous hospitalizations.  History of Electroconvulsive Shock Therapy: No Prior Suicide Attempts: No  Physical Exam: Constitutional: Vitals:   08/02/16 1408  Weight: 104 lb (47.2 kg)  Height: 5' 0.5" (1.537 m)   Vitals:   08/02/16 1408  BP: 102/68  Pulse: 90    General Appearance: alert, oriented, no acute distress  Musculoskeletal: Strength & Muscle Tone: within normal limits Gait & Station: normal Patient leans: straight.  Mental Status Examination/Evaluation: Objective: Attitude: Calm and cooperative  Appearance: Casual, appears to be stated age  Eye Contact::  Good  Speech:  Clear and Coherent and Normal Rate  Volume:  Normal  Mood:  Anxious and depressed  Affect:  Congruent  Thought Process:  Goal Directed, Linear and Logical  Orientation:  Full (Time, Place, and Person)  Thought Content:  Negative  Suicidal Thoughts:  No  Homicidal Thoughts:  No  Judgement:  Good  Insight:  Good  Concentration: good  Memory: Immediate-good Recent-good Remote-good  Recall: fair  Language: fair  Gait and Station: normal  [employer] of Knowledge: average  Psychomotor Activity:  Normal  Akathisia:   No  Handed:  Right  AIMS (if indicated):  Facial and Oral Movements  Muscles of Facial Expression: None, normal  Lips and Perioral Area: None, normal  Jaw: None, normal  Tongue: None, normal Extremity Movements: Upper (arms, wrists, hands, fingers): None, normal  Lower (legs, knees, ankles, toes): None, normal,  Trunk Movements:  Neck, shoulders, hips: None, normal,  Overall Severity : Severity of abnormal movements (highest score from questions above): None, normal  Incapacitation due to abnormal movements: None, normal  Patient's awareness of abnormal movements (rate only patient's report): No Awareness, Dental Status  Current problems with teeth and/or dentures?: No  Does patient usually wear dentures?: No    Assets:  Communication Skills Desire for Improvement Financial Resources/Insurance Housing Intimacy Leisure Time Physical Health Resilience Social Support Talents/Skills Transportation Vocational/Educational       Assessment: Axis I: Bipolar disorder- current episode unspecified; GAD; Anorexia Nervosa; Insomnia  Axis II: deferred    Plan: 1. Wellbutrin XL 450mg  po qD. Pt is aware that it can cause seizures, especially in individuals with a hx of seizures. Reports last seizure was in 1997. Has tolerated Wellbutrin without SE and would like to continue taking it.  2. Increase Prozac  To 80mg  po qD for mood  3. Geodon to 60mg  po qD for mood lability 4. Continue Melatonin   5. Topamax 100mg  po qHS for mood lability- pt started this medication in the 1997's for treatment of of seizures  6. Klonopin 1mg  po BID prn anxiety D/c Trazodone  Medication management with supportive therapy. Risks/benefits and SE of the medication discussed. Pt verbalized understanding and verbal consent obtained for treatment.  Affirm with the patient that the medications are taken as ordered. Patient expressed understanding of how their medications were to be used.    Continue to  monitor weight closely.  Labs: reviewed labs from PCP CBC WNL, CMP WNL, TSH WNL. Will scan into chart  reviewed 11/29/2015 EKG QTc 440, WNL  Therapy: brief supportive therapy provided. Discussed psychosocial stressors in detail.   Encouraged to continue therapy Pt was given info for [facility]  Pt denies SI and is at an acute low risk for suicide.Patient told to call clinic if any problems occur. Patient advised to go to ER if they should develop SI/HI, side effects, or if symptoms worsen. Has crisis numbers to call if needed. Pt verbalized understanding.  F/up in 8-12 weeks or sooner if needed  Oletta Darter, MD 08/02/2016

## 2016-08-08 ENCOUNTER — Ambulatory Visit: Payer: Self-pay

## 2016-08-22 DIAGNOSIS — Z1231 Encounter for screening mammogram for malignant neoplasm of breast: Secondary | ICD-10-CM | POA: Diagnosis not present

## 2016-10-09 ENCOUNTER — Ambulatory Visit (INDEPENDENT_AMBULATORY_CARE_PROVIDER_SITE_OTHER): Payer: PPO | Admitting: Psychiatry

## 2016-10-09 ENCOUNTER — Encounter (HOSPITAL_COMMUNITY): Payer: Self-pay | Admitting: Psychiatry

## 2016-10-09 DIAGNOSIS — F5105 Insomnia due to other mental disorder: Secondary | ICD-10-CM

## 2016-10-09 DIAGNOSIS — F411 Generalized anxiety disorder: Secondary | ICD-10-CM | POA: Diagnosis not present

## 2016-10-09 DIAGNOSIS — Z79899 Other long term (current) drug therapy: Secondary | ICD-10-CM

## 2016-10-09 DIAGNOSIS — F99 Mental disorder, not otherwise specified: Secondary | ICD-10-CM

## 2016-10-09 DIAGNOSIS — Z818 Family history of other mental and behavioral disorders: Secondary | ICD-10-CM

## 2016-10-09 DIAGNOSIS — F3130 Bipolar disorder, current episode depressed, mild or moderate severity, unspecified: Secondary | ICD-10-CM

## 2016-10-09 MED ORDER — TOPIRAMATE 100 MG PO TABS: ORAL_TABLET | ORAL | 0 refills | Status: DC

## 2016-10-09 MED ORDER — ZIPRASIDONE HCL 60 MG PO CAPS: 60.0000 mg | ORAL_CAPSULE | Freq: Every day | ORAL | 0 refills | Status: DC

## 2016-10-09 MED ORDER — BUPROPION HCL ER (XL) 150 MG PO TB24: 450.0000 mg | ORAL_TABLET | Freq: Every day | ORAL | 0 refills | Status: DC

## 2016-10-09 MED ORDER — FLUOXETINE HCL 40 MG PO CAPS: 80.0000 mg | ORAL_CAPSULE | Freq: Every day | ORAL | 0 refills | Status: DC

## 2016-10-09 MED ORDER — CLONAZEPAM 1 MG PO TABS: 1.0000 mg | ORAL_TABLET | Freq: Two times a day (BID) | ORAL | 0 refills | Status: DC

## 2016-10-09 NOTE — Progress Notes (Signed)
Patient ID: [name], female   DOB: January 11, 1971, 45 y.o.   MRN: [id number] Patient ID: [name], female   DOB: January 11, 1971, 45 y.o.   MRN: [id number] [facility] [id number] Progress Note  [name] [id number] 45 y.o.  10/09/2016 9:42 AM  Chief Complaint: "It could be better "  History of Present Illness: Patient is a 45 year old Caucasian woman with a history of bipolar disorder and eating disorder for here for a followup appointment.  States she was on Abilify for a really long time but it stopped working. She now wants to restart it as she feels Geodon is no longer effective.  States depression is worsening as it usually does in the winter. She is making an effort to be social.  Reports low motivation and anhedonia worthlessness and hopelessness continue. She is active in the house since Prozac was increased. States crying spells have decreased in frequency. States she thinks this is due to family stressors. Pt is trying to be bright and stay positive.    Pt is sleeping about 6 hrs/night with Klonopin 1mg  and Melatonin.Pt goes to 10pm and wakes up 5am. It takes 30-45 min to fall asleep but she does not wake up multiple times a night. Energy is low after 2pm.  Denies manic and hypomanic symptoms including periods of decreased need for sleep, increased energy, mood lability, impulsivity, FOI, and excessive spending.  States her eating disorder is much better. Pt is eating 3 meals/day.  Stress induced panic attacks are occurring 2x/week. They last for 4 hrs and then leave her tired.   Anxiety is decreased and no longer has a feeling of dread. Pt is taking Klonopin 1mg  twice a day and it helps a lot.   Pt has not been to therapy due to scheduling conflicts. She is looking for groups.   Pt is taking meds as prescribed and denies SE.   Suicidal Ideation: No Plan Formed: No Patient has means to carry out plan: No  Homicidal Ideation: No Plan Formed: No Patient has means  to carry out plan: No  Review of Systems: Psychiatric: Agitation: No Hallucination: No  Depressed Mood: Yes Insomnia: No Hypersomnia: No Altered Concentration: No Feels Worthless: No Grandiose Ideas: No Belief In Special Powers: No New/Increased Substance Abuse: No Compulsions: No    Review of Systems  Constitutional: Negative for chills, fever and weight loss.  HENT: Negative for congestion, ear pain, nosebleeds, sore throat and tinnitus.   Eyes: Negative for blurred vision, double vision, pain and redness.  Respiratory: Negative for cough, sputum production and wheezing.   Cardiovascular: Positive for palpitations. Negative for chest pain and leg swelling.  Gastrointestinal: Negative for abdominal pain, constipation, heartburn, nausea and vomiting.  Musculoskeletal: Negative for back pain, joint pain and neck pain.  Skin: Negative for itching and rash.  Neurological: Positive for headaches. Negative for dizziness, sensory change, seizures and loss of consciousness.  Psychiatric/Behavioral: Positive for depression. Negative for hallucinations, substance abuse and suicidal ideas. The patient is nervous/anxious. The patient does not have insomnia.      Past Medical, Family, Social History:  Denies drug, alcohol and nicotine use. Pt is married and lives her with husband. Pt is unemployed and has not worked since 2012. She used to work at [employer] but was fired due to mania. She is long term disability since she was 45yo.  Reports she has been fired from several jobs due to her mania.    Family History  Problem Relation  Age of Onset  . Schizophrenia Father   . Bipolar disorder Father   . Schizophrenia Paternal Grandmother   . Suicidality Neg Hx    Past Medical History:  Diagnosis Date  . Bipolar 1 disorder (HCC)   . Eating disorder   . Thyroid disease     Outpatient Encounter Prescriptions as of 10/09/2016  Medication Sig  . buPROPion (WELLBUTRIN XL) 150 MG 24 hr tablet  Take 3 tablets (450 mg total) by mouth daily.  . clonazePAM (KLONOPIN) 1 MG tablet Take 1 tablet (1 mg total) by mouth 2 (two) times daily.  Marland Kitchen FLUoxetine (PROZAC) 40 MG capsule Take 2 capsules (80 mg total) by mouth daily.  Marland Kitchen levothyroxine (SYNTHROID, LEVOTHROID) 25 MCG tablet Take 25 mcg by mouth daily before breakfast. Reported on 02/07/2016  . Melatonin 10 MG TBDP Take by mouth.  . Norgestimate-Ethinyl Estradiol Triphasic (ORTHO TRI-CYCLEN LO) 0.18/0.215/0.25 MG-25 MCG tab Take 1 tablet by mouth daily.  Marland Kitchen topiramate (TOPAMAX) 100 MG tablet TAKE 1 BY MOUTH DAILY  . ziprasidone (GEODON) 60 MG capsule Take 1 capsule (60 mg total) by mouth at bedtime.   No facility-administered encounter medications on file as of 10/09/2016.     Past Psychiatric History/Hospitalization(s): Anxiety: Yes Bipolar Disorder: Yes Depression: No Mania: No Psychosis: No Schizophrenia: No Personality Disorder: No Hospitalization for psychiatric illness: Yes last time in 2012 at [facility], reports hx of multiple previous hospitalizations.  History of Electroconvulsive Shock Therapy: No Prior Suicide Attempts: No  Physical Exam: Constitutional: Vitals:   10/09/16 0956  Weight: 102 lb 9.6 oz (46.5 kg)  Height: 5' 0.5" (1.537 m)   Vitals:   10/09/16 0956  BP: 114/66  Pulse: 80    General Appearance: alert, oriented, no acute distress  Musculoskeletal: Strength & Muscle Tone: within normal limits Gait & Station: normal Patient leans: straight.  Mental Status Examination/Evaluation: Objective: Attitude: Calm and cooperative  Appearance: Casual, appears to be stated age  Eye Contact::  Good  Speech:  Clear and Coherent and Normal Rate  Volume:  Normal  Mood:  Anxious and depressed  Affect:  Congruent  Thought Process:  Goal Directed, Linear and Logical  Orientation:  Full (Time, Place, and Person)  Thought Content:  Negative  Suicidal Thoughts:  No  Homicidal Thoughts:  No  Judgement:  Good   Insight:  Good  Concentration: good  Memory: Immediate-good Recent-good Remote-good  Recall: fair  Language: fair  Gait and Station: normal  [employer] of Knowledge: average  Psychomotor Activity:  Normal  Akathisia:  No  Handed:  Right  AIMS (if indicated):  Facial and Oral Movements  Muscles of Facial Expression: None, normal  Lips and Perioral Area: None, normal  Jaw: None, normal  Tongue: None, normal Extremity Movements: Upper (arms, wrists, hands, fingers): None, normal  Lower (legs, knees, ankles, toes): None, normal,  Trunk Movements:  Neck, shoulders, hips: None, normal,  Overall Severity : Severity of abnormal movements (highest score from questions above): None, normal  Incapacitation due to abnormal movements: None, normal  Patient's awareness of abnormal movements (rate only patient's report): No Awareness, Dental Status  Current problems with teeth and/or dentures?: No  Does patient usually wear dentures?: No    Assets:  Communication Skills Desire for Improvement Financial Resources/Insurance Housing Intimacy Leisure Time Physical Health Resilience Social Support Talents/Skills Transportation Vocational/Educational       Assessment: Axis I: Bipolar disorder- current episode unspecified; GAD; Anorexia Nervosa; Insomnia  Axis II: deferred    Plan:  1. Wellbutrin XL 450mg  po qD. Pt is aware that it can cause seizures, especially in individuals with a hx of seizures. Reports last seizure was in 1997. Has tolerated Wellbutrin without SE and would like to continue taking it.  2. Prozac to 80mg  po qD for mood  3. Geodon 60mg  po qD for mood lability. If symptoms worsen will start Abilify 4. Continue Melatonin   5. Topamax 100mg  po qHS for mood lability- pt started this medication in the 1997's for treatment of of seizures  6. Klonopin 1mg  po BID prn anxiety  Medication management with supportive therapy. Risks/benefits and SE of the medication  discussed. Pt verbalized understanding and verbal consent obtained for treatment.  Affirm with the patient that the medications are taken as ordered. Patient expressed understanding of how their medications were to be used.    Continue to monitor weight closely.  Labs: reviewed labs from PCP CBC WNL, CMP WNL, TSH WNL. Will scan into chart  reviewed 11/29/2015 EKG QTc 440, WNL  Therapy: brief supportive therapy provided. Discussed psychosocial stressors in detail.   Encouraged to continue therapy Pt was given info for [facility]  Pt denies SI and is at an acute low risk for suicide.Patient told to call clinic if any problems occur. Patient advised to go to ER if they should develop SI/HI, side effects, or if symptoms worsen. Has crisis numbers to call if needed. Pt verbalized understanding.  F/up in 4 weeks or sooner if needed  Oletta DarterSalina Mars Scheaffer, MD 10/09/2016

## 2016-10-31 ENCOUNTER — Other Ambulatory Visit (HOSPITAL_COMMUNITY): Payer: Self-pay

## 2016-10-31 MED ORDER — ESZOPICLONE 3 MG PO TABS: 3.0000 mg | ORAL_TABLET | Freq: Every evening | ORAL | 0 refills | Status: DC | PRN

## 2016-10-31 NOTE — Progress Notes (Signed)
Patient called and stated that she is having a hard time falling and staying asleep. She said that the Klonopin does not work for sleep. I spoke to Dr. Ladona Ridgelaylor and he advised ordering Lunesta 3 mg 1 po qhs. Patient has a follow up on 12/21 - a 30 day order was sent in, she will discuss further with Dr Michae KavaAgarwal at her follow up

## 2016-11-15 ENCOUNTER — Ambulatory Visit (INDEPENDENT_AMBULATORY_CARE_PROVIDER_SITE_OTHER): Payer: PPO | Admitting: Psychiatry

## 2016-11-15 ENCOUNTER — Encounter (HOSPITAL_COMMUNITY): Payer: Self-pay | Admitting: Psychiatry

## 2016-11-15 DIAGNOSIS — Z79899 Other long term (current) drug therapy: Secondary | ICD-10-CM

## 2016-11-15 DIAGNOSIS — F3130 Bipolar disorder, current episode depressed, mild or moderate severity, unspecified: Secondary | ICD-10-CM

## 2016-11-15 DIAGNOSIS — F5105 Insomnia due to other mental disorder: Secondary | ICD-10-CM | POA: Diagnosis not present

## 2016-11-15 DIAGNOSIS — F411 Generalized anxiety disorder: Secondary | ICD-10-CM

## 2016-11-15 DIAGNOSIS — F99 Mental disorder, not otherwise specified: Secondary | ICD-10-CM | POA: Diagnosis not present

## 2016-11-15 MED ORDER — BUPROPION HCL ER (XL) 150 MG PO TB24
450.0000 mg | ORAL_TABLET | Freq: Every day | ORAL | 0 refills | Status: DC
Start: 2016-11-15 — End: 2017-01-08

## 2016-11-15 MED ORDER — TOPIRAMATE 100 MG PO TABS: ORAL_TABLET | ORAL | 0 refills | Status: DC

## 2016-11-15 MED ORDER — ZIPRASIDONE HCL 40 MG PO CAPS: 40.0000 mg | ORAL_CAPSULE | Freq: Every day | ORAL | 0 refills | Status: DC

## 2016-11-15 MED ORDER — CLONAZEPAM 1 MG PO TABS: 1.0000 mg | ORAL_TABLET | Freq: Two times a day (BID) | ORAL | 0 refills | Status: DC

## 2016-11-15 MED ORDER — ARIPIPRAZOLE 5 MG PO TABS: 5.0000 mg | ORAL_TABLET | Freq: Every day | ORAL | 0 refills | Status: DC

## 2016-11-15 MED ORDER — ESZOPICLONE 3 MG PO TABS: 3.0000 mg | ORAL_TABLET | Freq: Every evening | ORAL | 0 refills | Status: DC | PRN

## 2016-11-15 MED ORDER — FLUOXETINE HCL 40 MG PO CAPS: 80.0000 mg | ORAL_CAPSULE | Freq: Every day | ORAL | 0 refills | Status: DC

## 2016-11-15 NOTE — Progress Notes (Signed)
Patient ID: [name], female   DOB: [birth year], 45 y.o.   MRN: [id number] Patient ID: [name], female   DOB: [birth year], 45 y.o.   MRN: [id number] [facility] [id number] Progress Note  [name] [id number] 45 y.o.  11/15/2016 11:12 AM  Chief Complaint: "I just don't want to do anything "  History of Present Illness: reviewed information below with patient on 11/15/16  and same as previous visits except as noted   Patient is a 45 year old Caucasian woman with a history of bipolar disorder and eating disorder for here for a followup appointment.  Pt has had a number of deaths recently. It has caused a lot of family drama.   States depression is worsening as it usually does in the winter. Pt has no desire to do anything or go anywhere.  Reports low motivation and anhedonia, worthlessness and hopelessness continue. She is not very active in the house. Denies crying spells. She wants to lie on her couch with her dog all day.   Pt is sleeping about 6 hrs/night with Klonopin 1mg  and Lunesta. Energy is low after 2pm.  Denies manic and hypomanic symptoms including periods of decreased need for sleep, increased energy, mood lability, impulsivity, FOI, and excessive spending.  Pt is eating 3 small meals a day. She gives example of bowl oatmal for breakfast, half sandwhich  For lunch and half of serving of food for dinner.   Pt is no longer having stress induced panic attacks.   Anxiety comes on in the evening when her husband comes home. She doesn't want to be anxious around him. Pt is taking Klonopin 1mg  once  a day and it helps a lot.   Pt has not been to therapy due to scheduling conflicts. She is looking for groups.   Pt is taking meds as prescribed and denies SE.   Suicidal Ideation: No Plan Formed: No Patient has means to carry out plan: No  Homicidal Ideation: No Plan Formed: No Patient has means to carry out plan: No  Review of  Systems: Psychiatric: Agitation: No Hallucination: No  Depressed Mood: Yes Insomnia: No Hypersomnia: No Altered Concentration: No Feels Worthless: Yes Grandiose Ideas: No Belief In Special Powers: No New/Increased Substance Abuse: No Compulsions: No    Review of Systems  Cardiovascular: Negative for chest pain, palpitations and leg swelling.  Neurological: Negative for dizziness, tremors, seizures and loss of consciousness.  Psychiatric/Behavioral: Positive for depression. Negative for hallucinations, memory loss, substance abuse and suicidal ideas. The patient is nervous/anxious. The patient does not have insomnia.      Past Medical, Family, Social History:  reviewed information with patient on 11/15/16  and same as previous visits except as noted Denies drug, alcohol and nicotine use. Pt is married and lives her with husband. Pt is unemployed and has not worked since 2012. She used to work at [employer] but was fired due to mania. She is long term disability since she was 45yo.  Reports she has been fired from several jobs due to her mania.    Family History  Problem Relation Age of Onset  . Schizophrenia Father   . Bipolar disorder Father   . Schizophrenia Paternal Grandmother   . Suicidality Neg Hx    Past Medical History:  Diagnosis Date  . Bipolar 1 disorder (HCC)   . Eating disorder   . Thyroid disease     Outpatient Encounter Prescriptions as of 11/15/2016  Medication Sig  . buPROPion [facility](WELLBUTRIN  XL) 150 MG 24 hr tablet Take 3 tablets (450 mg total) by mouth daily.  . clonazePAM (KLONOPIN) 1 MG tablet Take 1 tablet (1 mg total) by mouth 2 (two) times daily.  . Eszopiclone (ESZOPICLONE) 3 MG TABS Take 1 tablet (3 mg total) by mouth at bedtime as needed. Take immediately before bedtime  . FLUoxetine (PROZAC) 40 MG capsule Take 2 capsules (80 mg total) by mouth daily.  Marland Kitchen levothyroxine (SYNTHROID, LEVOTHROID) 25 MCG tablet Take 25 mcg by mouth daily before breakfast.  Reported on 02/07/2016  . Melatonin 10 MG TBDP Take by mouth.  . Norgestimate-Ethinyl Estradiol Triphasic (ORTHO TRI-CYCLEN LO) 0.18/0.215/0.25 MG-25 MCG tab Take 1 tablet by mouth daily.  Marland Kitchen topiramate (TOPAMAX) 100 MG tablet TAKE 1 BY MOUTH DAILY  . ziprasidone (GEODON) 60 MG capsule Take 1 capsule (60 mg total) by mouth at bedtime.   No facility-administered encounter medications on file as of 11/15/2016.     Past Psychiatric History/Hospitalization(s): Anxiety: Yes Bipolar Disorder: Yes Depression: No Mania: No Psychosis: No Schizophrenia: No Personality Disorder: No Hospitalization for psychiatric illness: Yes last time in 2012 at [facility], reports hx of multiple previous hospitalizations.  History of Electroconvulsive Shock Therapy: No Prior Suicide Attempts: No  Physical Exam: Constitutional: Vitals:   11/15/16 1127  Weight: 100 lb 6.4 oz (45.5 kg)  Height: 5' 0.5" (1.537 m)   Vitals:   11/15/16 1127  BP: 118/68  Pulse: 83    General Appearance: alert, oriented, no acute distress  Musculoskeletal: Strength & Muscle Tone: within normal limits Gait & Station: normal Patient leans: straight.  Mental Status Examination/Evaluation: reviewed information  on 11/15/16  and same as previous visits except as noted Objective: Attitude: Calm and cooperative  Appearance: Casual, appears to be stated age  Eye Contact::  Good  Speech:  Clear and Coherent and Normal Rate  Volume:  Normal  Mood:  Anxious and depressed  Affect:  Congruent  Thought Process:  Goal Directed, Linear and Logical  Orientation:  Full (Time, Place, and Person)  Thought Content:  Negative  Suicidal Thoughts:  No  Homicidal Thoughts:  No  Judgement:  Good  Insight:  Good  Concentration: good  Memory: Immediate-good Recent-good Remote-good  Recall: fair  Language: fair  Gait and Station: normal  [employer] of Knowledge: average  Psychomotor Activity:  Normal  Akathisia:  No  Handed:  Right   AIMS (if indicated):  Facial and Oral Movements  Muscles of Facial Expression: None, normal  Lips and Perioral Area: None, normal  Jaw: None, normal  Tongue: None, normal Extremity Movements: Upper (arms, wrists, hands, fingers): None, normal  Lower (legs, knees, ankles, toes): None, normal,  Trunk Movements:  Neck, shoulders, hips: None, normal,  Overall Severity : Severity of abnormal movements (highest score from questions above): None, normal  Incapacitation due to abnormal movements: None, normal  Patient's awareness of abnormal movements (rate only patient's report): No Awareness, Dental Status  Current problems with teeth and/or dentures?: No  Does patient usually wear dentures?: No    Assets:  Communication Skills Desire for Improvement Financial Resources/Insurance Housing Intimacy Leisure Time Physical Health Resilience Social Support Talents/Skills Transportation Vocational/Educational       Assessment: reviewed A&P below with patient on 11/15/16  and same as previous visits except as noted  Bipolar disorder- current episode depressed; GAD; Anorexia Nervosa; Insomnia     Plan: 1. Wellbutrin XL 450mg  po qD. Pt is aware that it can cause seizures, especially in individuals with  a hx of seizures. Reports last seizure was in 1997. Has tolerated Wellbutrin without SE and would like to continue taking it.  2. Prozac to 80mg  po qD for mood  3. Decrease Geodon 40mg  po qD for mood lability. Will slowly taper off 4. Continue Melatonin  For sleep 5. Topamax 100mg  po qHS for mood lability- pt started this medication in the 1997's for treatment of of seizures  6. Klonopin 1mg  po BID prn anxiety 7. Start trial of Abilify 5mg  po qD for mood augmenation 8. Lunesta 3mg  po qHS prn insomnia  Medication management with supportive therapy. Risks/benefits and SE of the medication discussed. Pt verbalized understanding and verbal consent obtained for treatment.  Affirm with  the patient that the medications are taken as ordered. Patient expressed understanding of how their medications were to be used.    Continue to monitor weight closely.  Labs: reviewed labs from PCP CBC WNL, CMP WNL, TSH WNL. Will scan into chart  reviewed 11/29/2015 EKG QTc 440, WNL  Therapy: brief supportive therapy provided. Discussed psychosocial stressors in detail.   Encouraged to continue therapy Pt was given info for [facility]  Pt denies SI and is at an acute low risk for suicide.Patient told to call clinic if any problems occur. Patient advised to go to ER if they should develop SI/HI, side effects, or if symptoms worsen. Has crisis numbers to call if needed. Pt verbalized understanding.  F/up in 6 weeks or sooner if needed  Oletta DarterSalina Alesa Echevarria, MD 11/15/2016

## 2017-01-08 ENCOUNTER — Ambulatory Visit (INDEPENDENT_AMBULATORY_CARE_PROVIDER_SITE_OTHER): Payer: PPO | Admitting: Psychiatry

## 2017-01-08 ENCOUNTER — Encounter (HOSPITAL_COMMUNITY): Payer: Self-pay | Admitting: Psychiatry

## 2017-01-08 DIAGNOSIS — F5105 Insomnia due to other mental disorder: Secondary | ICD-10-CM

## 2017-01-08 DIAGNOSIS — F411 Generalized anxiety disorder: Secondary | ICD-10-CM

## 2017-01-08 DIAGNOSIS — Z818 Family history of other mental and behavioral disorders: Secondary | ICD-10-CM

## 2017-01-08 DIAGNOSIS — Z79899 Other long term (current) drug therapy: Secondary | ICD-10-CM

## 2017-01-08 DIAGNOSIS — F3130 Bipolar disorder, current episode depressed, mild or moderate severity, unspecified: Secondary | ICD-10-CM

## 2017-01-08 DIAGNOSIS — F99 Mental disorder, not otherwise specified: Secondary | ICD-10-CM

## 2017-01-08 MED ORDER — ESZOPICLONE 3 MG PO TABS: 3.0000 mg | ORAL_TABLET | Freq: Every evening | ORAL | 0 refills | Status: DC | PRN

## 2017-01-08 MED ORDER — ZIPRASIDONE HCL 40 MG PO CAPS: 40.0000 mg | ORAL_CAPSULE | Freq: Every day | ORAL | 0 refills | Status: DC

## 2017-01-08 MED ORDER — FLUOXETINE HCL 40 MG PO CAPS: 80.0000 mg | ORAL_CAPSULE | Freq: Every day | ORAL | 0 refills | Status: DC

## 2017-01-08 MED ORDER — CLONAZEPAM 1 MG PO TABS: 1.0000 mg | ORAL_TABLET | Freq: Two times a day (BID) | ORAL | 0 refills | Status: DC

## 2017-01-08 MED ORDER — BUPROPION HCL ER (XL) 150 MG PO TB24: 450.0000 mg | ORAL_TABLET | Freq: Every day | ORAL | 0 refills | Status: DC

## 2017-01-08 MED ORDER — ARIPIPRAZOLE 5 MG PO TABS: 5.0000 mg | ORAL_TABLET | Freq: Every day | ORAL | 0 refills | Status: DC

## 2017-01-08 MED ORDER — TOPIRAMATE 100 MG PO TABS: ORAL_TABLET | ORAL | 0 refills | Status: DC

## 2017-01-08 NOTE — Progress Notes (Signed)
Patient ID: [name], female   DOB: [birth year], 46 y.o.   MRN: [id number] Patient ID: [name], female   DOB: 10/04/1971, 46 y.o.   MRN: [id number] [facility] [id number] Progress Note  [name] [id number] 46 y.o.  01/08/2017 10:11 AM  Chief Complaint: "I really think the Abilify is working "  History of Present Illness: reviewed information below with patient on 01/08/17  and same as previous visits except as noted   Patient is a 46 year old Caucasian woman with a history of bipolar disorder and eating disorder for here for a followup appointment.  States she feels more confident, calmer and happier since starting Abilify. States she just feels better. Pt wants to get a part time job.   Depression has significantly improved. She has moments of depression that come on once a day for a few minutes but she does not dwell on it. She is more motivated and anhedonia is gone. She back to cooking.  Pt is sleeping better and getting about 6-7 hrs/night with Klonopin 1mg  and Lunesta. Energy is fair  Denies manic and hypomanic symptoms including periods of decreased need for sleep, increased energy, mood lability, impulsivity, FOI, and excessive spending.  Pt is eating 3 meals a day and adding a lot more fruit/veggies.   Pt is no longer having stress induced panic attacks. Anxiety has resolved.   Pt has not been to therapy due to scheduling conflicts.   Pt is taking meds as prescribed and denies SE.   Suicidal Ideation: No Plan Formed: No Patient has means to carry out plan: No  Homicidal Ideation: No Plan Formed: No Patient has means to carry out plan: No  Review of Systems: Psychiatric: Agitation: No Hallucination: No  Depressed Mood: Yes Insomnia: No Hypersomnia: No Altered Concentration: No Feels Worthless: No Grandiose Ideas: No Belief In Special Powers: No New/Increased Substance Abuse: No Compulsions: No    Review of Systems  Constitutional:  Negative for chills, diaphoresis, fever and malaise/fatigue.  HENT: Negative for congestion, hearing loss, sinus pain and sore throat.   Neurological: Negative for dizziness, tremors, sensory change, seizures, loss of consciousness and headaches.  Psychiatric/Behavioral: Positive for depression. Negative for hallucinations, substance abuse and suicidal ideas. The patient is not nervous/anxious and does not have insomnia.      Past Medical, Family, Social History:  reviewed information with patient on 01/08/17  and same as previous visits except as noted Denies drug, alcohol and nicotine use. Pt is married and lives her with husband. Pt is unemployed and has not worked since 2012. She used to work at [employer] but was fired due to mania. She is long term disability since she was 46yo.  Reports she has been fired from several jobs due to her mania.    Family History  Problem Relation Age of Onset  . Schizophrenia Father   . Bipolar disorder Father   . Schizophrenia Paternal Grandmother   . Suicidality Neg Hx    Past Medical History:  Diagnosis Date  . Bipolar 1 disorder (HCC)   . Eating disorder   . Thyroid disease     Outpatient Encounter Prescriptions as of 01/08/2017  Medication Sig  . ARIPiprazole (ABILIFY) 5 MG tablet Take 1 tablet (5 mg total) by mouth daily.  Marland Kitchen buPROPion (WELLBUTRIN XL) 150 MG 24 hr tablet Take 3 tablets (450 mg total) by mouth daily.  . clonazePAM (KLONOPIN) 1 MG tablet Take 1 tablet (1 mg total) by mouth 2 (two)  times daily.  . Eszopiclone (ESZOPICLONE) 3 MG TABS Take 1 tablet (3 mg total) by mouth at bedtime as needed. Take immediately before bedtime  . FLUoxetine (PROZAC) 40 MG capsule Take 2 capsules (80 mg total) by mouth daily.  Marland Kitchen. levothyroxine (SYNTHROID, LEVOTHROID) 25 MCG tablet Take 25 mcg by mouth daily before breakfast. Reported on 02/07/2016  . Melatonin 10 MG TBDP Take by mouth.  . Norgestimate-Ethinyl Estradiol Triphasic (ORTHO TRI-CYCLEN LO)  0.18/0.215/0.25 MG-25 MCG tab Take 1 tablet by mouth daily.  Marland Kitchen. topiramate (TOPAMAX) 100 MG tablet TAKE 1 BY MOUTH DAILY  . ziprasidone (GEODON) 40 MG capsule Take 1 capsule (40 mg total) by mouth at bedtime.   No facility-administered encounter medications on file as of 01/08/2017.     Past Psychiatric History/Hospitalization(s): Anxiety: Yes Bipolar Disorder: Yes Depression: No Mania: No Psychosis: No Schizophrenia: No Personality Disorder: No Hospitalization for psychiatric illness: Yes [facility], reports hx of multiple previous hospitalizations.  History of Electroconvulsive Shock Therapy: No Prior Suicide Attempts: No  Physical Exam: Constitutional: Vitals:   01/08/17 0934  Weight: 96 lb (43.5 kg)  Height: 5' 0.53" (1.537 m)   Vitals:   01/08/17 0934  BP: 110/68  Pulse: 93    General Appearance: alert, oriented, no acute distress  Musculoskeletal: Strength & Muscle Tone: within normal limits Gait & Station: normal Patient leans: straight.  Mental Status Examination/Evaluation: reviewed information  on 01/08/17  and same as previous visits except as noted Objective: Attitude: Calm and cooperative  Appearance: Casual, appears to be stated age  Eye Contact::  Good  Speech:  Clear and Coherent and Normal Rate  Volume:  Normal  Mood:  euythmic  Affect:  Congruent  Thought Process:  Goal Directed, Linear and Logical  Orientation:  Full (Time, Place, and Person)  Thought Content:  Negative  Suicidal Thoughts:  No  Homicidal Thoughts:  No  Judgement:  Good  Insight:  Good  Concentration: good  Memory: Immediate-good Recent-good Remote-good  Recall: fair  Language: fair  Gait and Station: normal  [employer] of Knowledge: average  Psychomotor Activity:  Normal  Akathisia:  No  Handed:  Right  AIMS (if indicated):  Facial and Oral Movements  Muscles of Facial Expression: None, normal  Lips and Perioral Area: None, normal  Jaw: None, normal   Tongue: None, normal Extremity Movements: Upper (arms, wrists, hands, fingers): None, normal  Lower (legs, knees, ankles, toes): None, normal,  Trunk Movements:  Neck, shoulders, hips: None, normal,  Overall Severity : Severity of abnormal movements (highest score from questions above): None, normal  Incapacitation due to abnormal movements: None, normal  Patient's awareness of abnormal movements (rate only patient's report): No Awareness, Dental Status  Current problems with teeth and/or dentures?: No  Does patient usually wear dentures?: No    Assets:  Communication Skills Desire for Improvement Financial Resources/Insurance Housing Intimacy Leisure Time Physical Health Resilience Social Support Talents/Skills Transportation Vocational/Educational       Assessment: reviewed A&P below with patient on 01/08/17  and same as previous visits except as noted  Bipolar disorder- current episode depressed; GAD; Anorexia Nervosa; Insomnia     Plan: 1. Wellbutrin XL 450mg  po qD. Pt is aware that it can cause seizures, especially in individuals with a hx of seizures. Reports last seizure was in 1997. Has tolerated Wellbutrin without SE and would like to continue taking it.  2. Prozac 80mg  po qD for mood  3. Geodon 40mg  po qD  for mood lability. Will plan to slowly taper off in the summer 4. Continue Melatonin  For sleep 5. Topamax 100mg  po qHS for mood lability- pt started this medication in the 1997's for treatment of of seizures  6. Klonopin 1mg  po BID prn anxiety 7. Abilify 5mg  po qD for mood augmenation 8. Lunesta 3mg  po qHS prn insomnia  Medication management with supportive therapy. Risks/benefits and SE of the medication discussed. Pt verbalized understanding and verbal consent obtained for treatment.  Affirm with the patient that the medications are taken as ordered. Patient expressed understanding of how their medications were to be used.    Continue to monitor weight  closely.  Labs: reviewed labs from PCP CBC WNL, CMP WNL, TSH WNL. Will scan into chart  reviewed 11/29/2015 EKG QTc 440, WNL  Therapy: brief supportive therapy provided. Discussed psychosocial stressors in detail.    Pt denies SI and is at an acute low risk for suicide.Patient told to call clinic if any problems occur. Patient advised to go to ER if they should develop SI/HI, side effects, or if symptoms worsen. Has crisis numbers to call if needed. Pt verbalized understanding.  F/up in 3 months or sooner if needed  Oletta Darter, MD 01/08/2017

## 2017-01-29 DIAGNOSIS — N39490 Overflow incontinence: Secondary | ICD-10-CM | POA: Diagnosis not present

## 2017-02-28 ENCOUNTER — Ambulatory Visit (HOSPITAL_COMMUNITY): Payer: Self-pay | Admitting: Psychology

## 2017-03-19 ENCOUNTER — Ambulatory Visit (HOSPITAL_COMMUNITY): Payer: Self-pay | Admitting: Psychology

## 2017-03-20 ENCOUNTER — Ambulatory Visit (INDEPENDENT_AMBULATORY_CARE_PROVIDER_SITE_OTHER): Payer: PPO | Admitting: Psychology

## 2017-03-20 ENCOUNTER — Encounter (HOSPITAL_COMMUNITY): Payer: Self-pay | Admitting: Psychology

## 2017-03-20 DIAGNOSIS — F411 Generalized anxiety disorder: Secondary | ICD-10-CM | POA: Diagnosis not present

## 2017-03-20 DIAGNOSIS — F3130 Bipolar disorder, current episode depressed, mild or moderate severity, unspecified: Secondary | ICD-10-CM

## 2017-03-20 NOTE — Progress Notes (Signed)
Comprehensive Clinical Assessment (CCA) Note  03/20/2017 [name] [id number]  Visit Diagnosis:      ICD-9-CM ICD-10-CM   1. Bipolar I disorder, most recent episode depressed (HCC) 296.50 F31.30   2. GAD (generalized anxiety disorder) 300.02 F41.1       CCA Part One  Part One has been completed on paper by the patient.  (See scanned document in Chart Review)  CCA Part Two A  Intake/Chief Complaint:  CCA Intake With Chief Complaint CCA Part Two Date: 03/20/17 CCA Part Two Time: 0910 Chief Complaint/Presenting Problem: Pt reports she is presenting for counseling as has been in counseling before and wanting to return to work on goal of maintaining current job she has started.  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  pt reports she is struggling w/ expectations that she should know the computer system and feeling incompetent but wanting to feel confident.  Pt has been tx for mental health disorders she was 46y/o she reports.  She is currently being tx by Dr. Michae Kava for Bipolar 1 d/o most recent depressed, GAD and eating disorder.  pt  reports she feels improved w/ mood and feels good to be working and wants to feel good about her job and increase self confidence.   Patients Currently Reported Symptoms/Problems: pt reports anxiety peaks day before going to work- considering work avoidance, tearful, ruminating on work.  pt reports negative self talk about her ability to learn what she needs for the job.  pt reports she will feel this anxiety on way to work as well but once to work calms.  pt reports positive relationship w/ coworkers and her boss.  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  pt also reports low self confidence in self and apprearance and will seek ways of improving physical appearance- botox, facial peels etc.  pt denies any  restricting of diet or calories.  pt reports she doesn't view self as overweight and want to loss weight.  pt reports comfortable w/ her weight.  Pt reports lack of appetiite. Collateral Involvement: Dr. Arville Lime notes Individual's Strengths: support of her husband, support of her 2 close friends and her mother.  working Systems developer- enjoys interaction.  enjoys going out to eat and going to the movies.  Individual's Preferences: working on her self confidence; feel good about how she is doing w/ her job and maintain her job.  Type of Services Patient Feels Are Needed: counseling and continued medication managment.   Mental Health Symptoms Depression:  Depression: Worthlessness  Mania:  Mania: N/A  Anxiety:   Anxiety: Worrying, Tension, Restlessness  Psychosis:  Psychosis: N/A  Trauma:  Trauma: N/A  Obsessions:  Obsessions: N/A  Compulsions:  Compulsions: N/A  Inattention:  Inattention: N/A  Hyperactivity/Impulsivity:  Hyperactivity/Impulsivity: N/A  Oppositional/Defiant Behaviors:  Oppositional/Defiant Behaviors: N/A  Borderline Personality:  Emotional Irregularity: N/A  Other Mood/Personality Symptoms:  Other Mood/Personality Symtpoms: pt reports doesn't feel good about her physical appearance    Mental Status Exam Appearance and self-care  Stature:  Stature: Small  Weight:  Weight: Thin  Clothing:  Clothing: Neat/clean  Grooming:  Grooming: Well-groomed  Cosmetic use:  Cosmetic Use: Age appropriate  Posture/gait:  Posture/Gait: Normal  Motor activity:  Motor Activity: Not Remarkable  Sensorium  Attention:  Attention: Normal  Concentration:  Concentration: Normal  Orientation:  Orientation: X5  Recall/memory:  Recall/Memory: Normal  Affect and Mood  Affect:  Affect: Anxious  Mood:  Mood: Anxious  Relating  Eye contact:  Eye Contact: Fleeting  Facial expression:  Facial Expression: Anxious  Attitude toward examiner:  Attitude Toward Examiner: Cooperative  Thought and Language   Speech flow: Speech Flow: Normal  Thought content:  Thought Content: Appropriate to mood and circumstances  Preoccupation:     Hallucinations:     Organization:     Company secretary of Knowledge:  Fund of Knowledge: Average  Intelligence:  Intelligence: Average  Abstraction:  Abstraction: Normal  Judgement:  Judgement: Normal  Reality Testing:  Reality Testing: Adequate  Insight:  Insight: Fair  Decision Making:  Decision Making: Normal  Social Functioning  Social Maturity:  Social Maturity: Responsible  Social Judgement:  Social Judgement: Normal  Stress  Stressors:  Stressors: Work, Transitions  Coping Ability:  Coping Ability: Building surveyor Deficits:     Supports:      Family and Psychosocial History: Family history Marital status: Married Number of Years Married: 10 Additional relationship information: 2nd marriage Are you sexually active?: Yes Does patient have children?: No  Childhood History:  Childhood History By whom was/is the patient raised?: Both parents Patient's description of current relationship with people who raised him/her: Pt reports her mother is supportive Does patient have siblings?: No Did patient suffer any verbal/emotional/physical/sexual abuse as a child?: No Did patient suffer from severe childhood neglect?: No Has patient ever been sexually abused/assaulted/raped as an adolescent or adult?: No Was the patient ever a victim of a crime or a disaster?: No  CCA Part Two B  Employment/Work Situation: Employment / Work Psychologist, occupational Employment situation: On disability (employed 12 hours a week) Why is patient on disability: mental health How long has patient been on disability: for 20 years Patient's job has been impacted by current illness: Yes Describe how patient's job has been impacted: pt lack of self confidence What is the longest time patient has a held a job?: currently 1 month Has patient ever been in the [employer]?: No Are  There Guns or Other Weapons in Your Home?: Yes Are These Weapons Safely Secured?: Yes  Education: Education Did Garment/textile technologist From [employer]?: Yes Did Theme park manager?: Yes Did You Have Any Difficulty At [employer]?: Yes (difficulty concentrating)  Religion: Religion/Spirituality Are You A Religious Person?: No  Leisure/Recreation: Leisure / Recreation Leisure and Hobbies: eating out, time w/ husband, going to movies  Exercise/Diet: Exercise/Diet Do You Exercise?: Yes What Type of Exercise Do You Do?: Run/Walk (walks w/ the dog) How Many Times a Week Do You Exercise?: 1-3 times a week Have You Gained or Lost A Significant Amount of Weight in the Past Six Months?: No Do You Follow a Special Diet?: No Do You Have Any Trouble Sleeping?: No  CCA Part Two C  Alcohol/Drug Use: Alcohol / Drug Use History of alcohol / drug use?: No history of alcohol / drug abuse                      CCA Part Three  ASAM's:  Six Dimensions of Multidimensional Assessment  Dimension 1:  Acute Intoxication and/or Withdrawal Potential:     Dimension 2:  Biomedical Conditions and Complications:     Dimension 3:  Emotional, Behavioral, or Cognitive Conditions and Complications:     Dimension 4:  Readiness to Change:     Dimension 5:  Relapse, Continued use, or Continued Problem Potential:     Dimension 6:  Recovery/Living Environment:      Substance use Disorder (SUD)    Social Function:  Social Functioning Social Maturity: Responsible Social Judgement: Normal  Stress:  Stress Stressors: Work, Transitions Coping Ability: Overwhelmed Patient Takes Medications The Way The Doctor Instructed?: Yes Priority Risk: Low Acuity  Risk Assessment- Self-Harm Potential: Risk Assessment For Self-Harm Potential Thoughts of Self-Harm: No current thoughts Method: No plan  Risk Assessment -Dangerous to Others Potential: Risk Assessment For Dangerous to Others Potential Method: No Plan  DSM5  Diagnoses: Patient Active Problem List   Diagnosis Date Noted  . Bipolar I disorder, most recent episode depressed (HCC) 08/16/2015  . Insomnia 08/16/2015  . Bipolar I disorder, current or most recent episode hypomanic (HCC) 03/03/2015  . GAD (generalized anxiety disorder) 09/30/2014  . Anorexia nervosa 11/18/2013  . Bipolar I disorder, most recent episode (or current) depressed, unspecified 10/28/2013    Patient Centered Plan: Patient is on the following Treatment Plan(s):  Anxiety and low self worth- see tx plan on file  Recommendations for Services/Supports/Treatments: Recommendations for Services/Supports/Treatments Recommendations For Services/Supports/Treatments: Individual Therapy, Medication Management  Treatment Plan Summary:   Pt to f/u w/ weekly counseling to assist coping w/ increased anxiety related to job and build self confidence. Pt to continue to f/u as scheduled w/ Dr. Michae Kava.   Forde Radon

## 2017-04-16 ENCOUNTER — Ambulatory Visit (HOSPITAL_COMMUNITY): Payer: Self-pay | Admitting: Psychology

## 2017-04-18 ENCOUNTER — Ambulatory Visit (INDEPENDENT_AMBULATORY_CARE_PROVIDER_SITE_OTHER): Payer: PPO | Admitting: Psychiatry

## 2017-04-18 ENCOUNTER — Encounter (HOSPITAL_COMMUNITY): Payer: Self-pay | Admitting: Psychiatry

## 2017-04-18 VITALS — BP 100/60 | HR 80 | Ht 59.5 in | Wt 91.8 lb

## 2017-04-18 DIAGNOSIS — Z882 Allergy status to sulfonamides status: Secondary | ICD-10-CM | POA: Diagnosis not present

## 2017-04-18 DIAGNOSIS — F411 Generalized anxiety disorder: Secondary | ICD-10-CM

## 2017-04-18 DIAGNOSIS — G4700 Insomnia, unspecified: Secondary | ICD-10-CM

## 2017-04-18 DIAGNOSIS — F5000 Anorexia nervosa, unspecified: Secondary | ICD-10-CM

## 2017-04-18 DIAGNOSIS — Z79899 Other long term (current) drug therapy: Secondary | ICD-10-CM

## 2017-04-18 DIAGNOSIS — F3130 Bipolar disorder, current episode depressed, mild or moderate severity, unspecified: Secondary | ICD-10-CM | POA: Diagnosis not present

## 2017-04-18 DIAGNOSIS — F99 Mental disorder, not otherwise specified: Secondary | ICD-10-CM

## 2017-04-18 DIAGNOSIS — Z888 Allergy status to other drugs, medicaments and biological substances status: Secondary | ICD-10-CM | POA: Diagnosis not present

## 2017-04-18 DIAGNOSIS — Z818 Family history of other mental and behavioral disorders: Secondary | ICD-10-CM

## 2017-04-18 DIAGNOSIS — F5105 Insomnia due to other mental disorder: Secondary | ICD-10-CM

## 2017-04-18 MED ORDER — CLONAZEPAM 1 MG PO TABS: 1.0000 mg | ORAL_TABLET | Freq: Two times a day (BID) | ORAL | 0 refills | Status: DC

## 2017-04-18 MED ORDER — TOPIRAMATE 100 MG PO TABS: ORAL_TABLET | ORAL | 0 refills | Status: DC

## 2017-04-18 MED ORDER — ARIPIPRAZOLE 5 MG PO TABS: 5.0000 mg | ORAL_TABLET | Freq: Every day | ORAL | 0 refills | Status: DC

## 2017-04-18 MED ORDER — ZIPRASIDONE HCL 40 MG PO CAPS: 40.0000 mg | ORAL_CAPSULE | Freq: Every day | ORAL | 0 refills | Status: DC

## 2017-04-18 MED ORDER — FLUOXETINE HCL 40 MG PO CAPS: 80.0000 mg | ORAL_CAPSULE | Freq: Every day | ORAL | 0 refills | Status: DC

## 2017-04-18 MED ORDER — TRAZODONE HCL 50 MG PO TABS: 25.0000 mg | ORAL_TABLET | Freq: Every day | ORAL | 0 refills | Status: DC

## 2017-04-18 MED ORDER — ESZOPICLONE 3 MG PO TABS: 3.0000 mg | ORAL_TABLET | Freq: Every evening | ORAL | 0 refills | Status: DC | PRN

## 2017-04-18 MED ORDER — BUPROPION HCL ER (XL) 150 MG PO TB24: 450.0000 mg | ORAL_TABLET | Freq: Every day | ORAL | 0 refills | Status: DC

## 2017-04-18 NOTE — Progress Notes (Signed)
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 She loved it. She quit because she was not helpful. She is now looking finding a job at [employer].   Pt is eating 3 meals a day. One meal is a protein shake. The other 2 meals are small. Pt is not trying to lose weight but wants to maintain her current weight.   Mood is really good. She denies depression and thinks it is due to Abilify. She denies anhedonia, worthlessness. Denies SI/HI. She goes out walking the dog and is social with friends.   Sleep is fair when she takes Vistaril 25mg  at bedtime. She goes to bed at 10pm and wakes up at 6am. Energy is fair but she tires in the afternoon.   Denies manic and hypomanic symptoms including periods of decreased need for sleep, increased energy, mood lability, impulsivity, FOI, and excessive spending. She denies SI/HI.   She denies anxiety and states she feels good. She takes Klonopin 1mg  at bedtime.   Taking meds as prescribed and denies SE.     Visit Diagnosis:    ICD-9-CM ICD-10-CM   1. Anorexia nervosa 307.1 F50.00 FLUoxetine (PROZAC) 40 MG capsule  2. Bipolar I disorder, most recent episode depressed (HCC) 296.50 F31.30 ziprasidone (GEODON) 40 MG capsule     topiramate (TOPAMAX) 100 MG tablet     FLUoxetine (PROZAC) 40 MG capsule     buPROPion (WELLBUTRIN XL) 150 MG 24 hr tablet     ARIPiprazole (ABILIFY) 5 MG tablet  3. GAD (generalized anxiety disorder) 300.02 F41.1 FLUoxetine (PROZAC) 40 MG capsule     clonazePAM (KLONOPIN) 1 MG tablet  4. Insomnia due to other mental disorder 300.9 F51.05 clonazePAM (KLONOPIN) 1 MG tablet   327.02 F99       Past Psychiatric History:  Anxiety: Yes Bipolar Disorder: Yes Depression: No Mania: No Psychosis: No Schizophrenia: No Personality Disorder: No Hospitalization for psychiatric  illness: [facility], reports hx of multiple previous hospitalizations.  History of Electroconvulsive Shock Therapy: No Prior Suicide Attempts: No  Past Medical History:  Past Medical History:  Diagnosis Date  . Bipolar 1 disorder (HCC)   . Eating disorder   . Thyroid disease     Past Surgical History:  Procedure Laterality Date  . LEEP    . MULTIPLE TOOTH EXTRACTIONS    . TYMPANOSTOMY TUBE PLACEMENT      Family Psychiatric History: Family History  Problem Relation Age of Onset  . Schizophrenia Father   . Bipolar disorder Father   . Schizophrenia Paternal Grandmother   . Suicidality Neg Hx     Social History:  Social History   Social History  . Marital status: Divorced    Spouse name: N/A  . Number of children: N/A  . Years of education: N/A   Social History Main Topics  . Smoking status: Never Smoker  . Smokeless tobacco: Never Used  . Alcohol use No  . Drug use: No  . Sexual activity: Not on file   Other Topics Concern  . Not on file   Social History Narrative  . No narrative on file    Allergies:  Allergies  Allergen Reactions  . Kasandra KnudsenLatuda [Lurasidone Hcl] Nausea And Vomiting    Complete GI upset/ nausea, vomiting and  diarrhea.   . Sulfa Antibiotics Nausea And Vomiting    Metabolic Disorder Labs: No results found for: HGBA1C, MPG No results found for: PROLACTIN No results found for: CHOL, TRIG, HDL, CHOLHDL, VLDL, LDLCALC   Current Medications: Current Outpatient Prescriptions  Medication Sig Dispense Refill  . ARIPiprazole (ABILIFY) 5 MG tablet Take 1 tablet (5 mg total) by mouth daily. 90 tablet 0  . buPROPion (WELLBUTRIN XL) 150 MG 24 hr tablet Take 3 tablets (450 mg total) by mouth daily. 270 tablet 0  . clonazePAM (KLONOPIN) 1 MG tablet Take 1 tablet (1 mg total) by mouth 2 (two) times daily. 180 tablet 0  . Eszopiclone (ESZOPICLONE) 3 MG TABS Take 1 tablet (3 mg total) by mouth at bedtime as needed. Take immediately before  bedtime 90 tablet 0  . FLUoxetine (PROZAC) 40 MG capsule Take 2 capsules (80 mg total) by mouth daily. 180 capsule 0  . levothyroxine (SYNTHROID, LEVOTHROID) 25 MCG tablet Take 25 mcg by mouth daily before breakfast. Reported on 02/07/2016    . Melatonin 10 MG TBDP Take by mouth.    . Norgestimate-Ethinyl Estradiol Triphasic (ORTHO TRI-CYCLEN LO) 0.18/0.215/0.25 MG-25 MCG tab Take 1 tablet by mouth daily.    Marland Kitchen topiramate (TOPAMAX) 100 MG tablet TAKE 1 BY MOUTH DAILY 90 tablet 0  . ziprasidone (GEODON) 40 MG capsule Take 1 capsule (40 mg total) by mouth at bedtime. 90 capsule 0   No current facility-administered medications for this visit.     Musculoskeletal: Strength & Muscle Tone: within normal limits Gait & Station: normal Patient leans: N/A  Psychiatric Specialty Exam: Review of Systems  Musculoskeletal: Negative for back pain, joint pain and neck pain.  Neurological: Positive for dizziness. Negative for tremors, sensory change and headaches.  Psychiatric/Behavioral: Negative for depression, hallucinations, substance abuse and suicidal ideas. The patient has insomnia. The patient is not nervous/anxious.     Blood pressure 100/60, pulse 80, height 4' 11.5" (1.511 m), weight 91 lb 12.8 oz (41.6 kg).Body mass index is 18.23 kg/m.  General Appearance: Fairly Groomed  Eye Contact:  Good  Speech:  Clear and Coherent and Normal Rate  Volume:  Normal  Mood:  Euthymic  Affect:  Blunt  Thought Process:  Goal Directed and Descriptions of Associations: Intact  Orientation:  Full (Time, Place, and Person)  Thought Content: Logical   Suicidal Thoughts:  No  Homicidal Thoughts:  No  Memory:  Immediate;   Good Recent;   Good Remote;   Good  Judgement:  Good  Insight:  Good  Psychomotor Activity:  Normal  Concentration:  Concentration: Good and Attention Span: Good  Recall:  Good  Fund of Knowledge: Good  Language: Good  Akathisia:  No  Handed:  Right  AIMS (if indicated):  AIMS:   Facial and Oral Movements  Muscles of Facial Expression: None, normal  Lips and Perioral Area: None, normal  Jaw: None, normal  Tongue: None, normal Extremity Movements: Upper (arms, wrists, hands, fingers): None, normal  Lower (legs, knees, ankles, toes): None, normal,  Trunk Movements:  Neck, shoulders, hips: None, normal,  Overall Severity : Severity of abnormal movements (highest score from questions above): None, normal  Incapacitation due to abnormal movements: None, normal  Patient's awareness of abnormal movements (rate only patient's report): No Awareness, Dental Status  Current problems with teeth and/or dentures?: No  Does patient usually wear dentures?: No     Assets:  Communication Skills Desire for Improvement Housing  ADL's:  Intact  Cognition:  WNL  Sleep:  fair     Treatment Plan Summary:Medication management   Assessment: Bipolar disorder- current episode depressed; GAD; Anorexia Nervosa; Insomnia   Medication management with supportive therapy. Risks/benefits and SE of the medication discussed. Pt verbalized understanding and verbal consent obtained for treatment.  Affirm with the patient that the medications are taken as ordered. Patient expressed understanding of how their medications were to be used.    Meds:  1. Wellbutrin XL 450mg  po qD. Pt reports last seizure was in 1997 and she has tolerated Wellbutrin without SE or AR and would like to continue taking it 2. Prozac 80mh po qD for depression and anxiety 3. Geodon 40mg  po qD for Bipolar disorder- plan to taper off slowly 4. Melatonin OCT for sleep 5. Topamax 100mg  po qHS for Bipolar disorder- pt started this med in 1997 for her seizures 6. Klonopin 1mg  po BID prn anxiety 7. Abilify 5mg  po qD for Bipolar disorder 8. D/c Lunesta and start Trazodone 25mg  po qHS prn insomnia Pt has tolerated and is benefiting from treatment with antipsychotics. Pt does not want meds changed at this time since she is  doing so well.    Labs: none   Therapy: brief supportive therapy provided. Discussed psychosocial stressors in detail.     Consultations:  Encouraged to continue individual therapy  Pt denies SI and is at an acute low risk for suicide. Patient told to call clinic if any problems occur. Patient advised to go to ER if they should develop SI/HI, side effects, or if symptoms worsen. Has crisis numbers to call if needed. Pt verbalized understanding.  F/up in 3 months or sooner if needed    Oletta Darter, MD 04/18/2017, 9:43 AM

## 2017-04-23 ENCOUNTER — Ambulatory Visit (INDEPENDENT_AMBULATORY_CARE_PROVIDER_SITE_OTHER): Payer: PPO | Admitting: Psychology

## 2017-04-23 DIAGNOSIS — F5000 Anorexia nervosa, unspecified: Secondary | ICD-10-CM

## 2017-04-23 DIAGNOSIS — F3130 Bipolar disorder, current episode depressed, mild or moderate severity, unspecified: Secondary | ICD-10-CM | POA: Diagnosis not present

## 2017-04-23 DIAGNOSIS — F411 Generalized anxiety disorder: Secondary | ICD-10-CM | POA: Diagnosis not present

## 2017-04-23 NOTE — Progress Notes (Signed)
   THERAPIST PROGRESS NOTE  Session Time: 9.03am-9.47am  Participation Level: Active  Behavioral Response: Well GroomedAlertaffect bright  Type of Therapy: Individual Therapy  Treatment Goals addressed: Diagnosis: bipolar, GAD and goal 1.  Interventions: CBT and Supportive  Summary: [name] is a 46 y.o. female who presents with affect bright.  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  Pt reported that she was still struggling w/ learning the codes, had worked Engineer, petroleumw manager about and came to realization not a good fit.  Pt reported that instead of internalizing that something wrong w/ her- pt remained focused that not a good fit, looked for other job possibilities and spending time getting out of house. Pt discussed positive relationship w/ 2 neighbors getting together at least 1x a week.  Pt reports that stressors is upcoming w/ mother in law staying for a couple days in June.  Pt is able to identify negative thoughts and reframe and focus on how to make best of her visit.  Pt reported that she is struggling w/ her eating- that doesn't have appetite and is down to 90 lbs- pt reports that over past 9 months that is a loss of about 26 lbs.  Pt reports she isn't trying to restrict- just really not hungry but aware not health and can be a patter that develops w/ not feeling control in other areas. Pt discussed how gets focused on judging what she does eat- ie. A candy bar.  Pt is able to reframe and discuss focusing on enjoying what she does eat- avoiding judgements and increasing focus on being mindful to snack throughout the day.  Suicidal/Homicidal: Nowithout intent/plan  Therapist Response: Assessed pt current functioning per pt report. Processed w/pt her decision to quit her job and assisted pt in focus on positive self statements re: her decision and keeping focused on engaged outside of household.  Explored w/pt stressor- avoiding internalizing and focusing  on coping through visit w/ mother in law.  Discussed pattern of pt eating-lack of appetite and assisting in reframing judgement re: food choices.  Discussed mindful eating.     Plan: Return again in 2 weeks.  Diagnosis: Bipolar 1 D/O, GAD, Anorezia Nervosa   Dayton Kenley, LPC 04/23/2017

## 2017-05-01 ENCOUNTER — Ambulatory Visit (INDEPENDENT_AMBULATORY_CARE_PROVIDER_SITE_OTHER): Payer: PPO | Admitting: Psychology

## 2017-05-01 DIAGNOSIS — F411 Generalized anxiety disorder: Secondary | ICD-10-CM | POA: Diagnosis not present

## 2017-05-01 DIAGNOSIS — F3130 Bipolar disorder, current episode depressed, mild or moderate severity, unspecified: Secondary | ICD-10-CM | POA: Diagnosis not present

## 2017-05-01 NOTE — Progress Notes (Signed)
   THERAPIST PROGRESS NOTE  Session Time: 10.57am-11.30am  Participation Level: Active  Behavioral Response: Well GroomedAlertaffect bright  Type of Therapy: Individual Therapy  Treatment Goals addressed: Diagnosis: Bipolar 1 and gAd and goal 1.  Interventions: CBT  Summary: [name] is a 46 y.o. female who presents with affect bright.  Pt reports overall she has been feeling good about self and mood has been good.  Pt reported that her mother in law and brother in law will be visiting tomorrow for several days.  she is focusing on being present in positive way and knowing that if becomes overwhelmed she has friends that are a support and how have offered for her to come over if needed.  Pt reported that she has also expressed to husband about not taking care of them- babysitting as has in past.  Pt reported that she has lost 2 more lbs since last visit down to 88 and is concerned about this.  Pt reports that she has been eating more and so doesn't understand.  Pt was able to agree to not focus on weight and not weight self daily.  Pt reported that it has become so much routine and doesn't feel good about.  Pt discussed self goal to throw away scale w/ husband tonight and how to make this happen. Pt reports that friend support has been very positive and has enjoyed time w/ them.  Suicidal/Homicidal: Nowithout intent/plan  Therapist Response: Assessed pt current functioning per pt report.  Had pt identify distortions and negative self talk and reframe.  Explored w/pt her want to discard scale and had pt identify her plan to accomplish this. Processed w/pt interactions w/ friends and family and setting boundaries.  Plan: Return again in 3 weeks.  Diagnosis: GAD, Bipolar 1 D/O    Amberli Ruegg, LPC 05/01/2017

## 2017-05-03 DIAGNOSIS — N760 Acute vaginitis: Secondary | ICD-10-CM | POA: Diagnosis not present

## 2017-05-06 ENCOUNTER — Other Ambulatory Visit (HOSPITAL_COMMUNITY): Payer: Self-pay | Admitting: Psychiatry

## 2017-05-06 DIAGNOSIS — F5105 Insomnia due to other mental disorder: Secondary | ICD-10-CM

## 2017-05-06 DIAGNOSIS — F99 Mental disorder, not otherwise specified: Principal | ICD-10-CM

## 2017-05-30 ENCOUNTER — Ambulatory Visit (HOSPITAL_COMMUNITY): Payer: Self-pay | Admitting: Psychology

## 2017-06-13 ENCOUNTER — Ambulatory Visit (INDEPENDENT_AMBULATORY_CARE_PROVIDER_SITE_OTHER): Payer: PPO | Admitting: Psychology

## 2017-06-13 ENCOUNTER — Telehealth (HOSPITAL_COMMUNITY): Payer: Self-pay

## 2017-06-13 DIAGNOSIS — F3130 Bipolar disorder, current episode depressed, mild or moderate severity, unspecified: Secondary | ICD-10-CM | POA: Diagnosis not present

## 2017-06-13 DIAGNOSIS — F411 Generalized anxiety disorder: Secondary | ICD-10-CM

## 2017-06-13 DIAGNOSIS — F5000 Anorexia nervosa, unspecified: Secondary | ICD-10-CM

## 2017-06-13 NOTE — Progress Notes (Signed)
   THERAPIST PROGRESS NOTE  Session Time: 9.19am-9.48am  Participation Level: Active  Behavioral Response: Well GroomedConfusedDepressed  Type of Therapy: Individual Therapy  Treatment Goals addressed: Diagnosis: Bipolar, GAD, Anorexia and goal 1.  Interventions: CBT, Supportive and Other: self care  Summary: [name] is a 46 y.o. female who presents with affect down.  Pt called stating she was running late as she was lost.  Pt has been to the office builiding several times w/out issue.  Pt reports she driving she is jerky and not reacting well.  Pt reports she has been more confused and struggling w/ clarity and how to do things.  Pt reports she has wanted to be alone more.  Pt reported that she hasn't had any med changes or change in way taking meds. Pt thought process seemed slow and less clear.  Pt reports no further weight loss and has been eating meals and not restricting dairy.  Pt agrees for continued focus on eating her meals- no focus on weight.  Pt reports her friend is coming over today to hang out and watch movie.  Pt reports only stressor recent is learning mom had been through tx for breast cancer.  Pt reports some worry about own health because- but denies ruminating on.     Suicidal/Homicidal: Nowithout intent/plan  Therapist Response: ASsessed pt current functioning per pt report.  Processed w/pt confusion- explored pt self care.  Encouraged pt to continue w/ meals, not weighing daily.  Checked in w/ nurse, Everlene BallsShawn Taylor and he is going to f/u w/ Dr. Michae KavaAgarwal while pt is present re: pt confusion.   Plan: Return again in 2 weeks.  Diagnosis: Bipolar 1 Depressed, Anorexia    Bobbiejo Ishikawa, LPC 06/13/2017

## 2017-06-13 NOTE — Telephone Encounter (Signed)
Medication management - Met with patient after [name], therapist saw patient today and was concerned about patient's reported increased confusion and reported increased forgetfulness later as patient had forgotten how to get to [facility] office today.  Patient presented with flat affect, level mood but with noted altered gait as she had to catch herself once not to fall.  Patient reported she had noticed she has been more forgetful in the mornings and shaky at times.  Reviewed patient's current medications as patient reports now taking all medication at night.  States she has not eaten anything or drunk anything yet today and her blood pressure was a little lower than is her typical at 89/60.  Met with Dr. Doyne Keel who also saw and spoke to patient.  Patient agreed to change Wellbutrin XL to the mornings and if does not need Trazodone at night can do without it as patient reports she typically feels better and is less sluggish and confused as the day goes by and is fine by noon.  Patient agreed if symptoms continue to also follow up with her PCP for possible blood work and further investigation.  Patient ate a granola bar, drunk some coffee and sat for a few minutes.  Patient then reported to this nurse she felt much better, was not shaky or confused and felt good to return home.  Patient to call if any further problems and will keep appointment 07/25/17. Patient again reviewed medications with nurse and agreed with Dr.Agarwal's plan to take Wellbutrin XL medication in the mornings, to eat and drink something in the mornings and to then see if still needs Trazodone at night as was instructed she could try doing without it if can sleep okay without its use.  Patient stated understanding all plans and verbalized plans back to this nurse.  Patient  Left clinic without any unsteady gait and noted more clear with thoughts and communication.

## 2017-06-20 ENCOUNTER — Telehealth (HOSPITAL_COMMUNITY): Payer: Self-pay

## 2017-06-20 DIAGNOSIS — F5105 Insomnia due to other mental disorder: Secondary | ICD-10-CM

## 2017-06-20 DIAGNOSIS — F99 Mental disorder, not otherwise specified: Principal | ICD-10-CM

## 2017-06-20 NOTE — Telephone Encounter (Signed)
Yes we can refill Trazodone 50mg  qhS with enough to get to next scheduled appt

## 2017-06-20 NOTE — Telephone Encounter (Signed)
Patient is calling for a refill on Trazodone. She was taking 25 mg then she stopped Lunesta and went to 50 mg so she ran out early. Please review and advise, thank you

## 2017-06-21 MED ORDER — TRAZODONE HCL 50 MG PO TABS: 50.0000 mg | ORAL_TABLET | Freq: Every day | ORAL | 0 refills | Status: DC

## 2017-06-21 NOTE — Telephone Encounter (Signed)
Sent in new rx for 50 mg qhs #45. I called the patient to let herknow

## 2017-07-08 ENCOUNTER — Telehealth (HOSPITAL_COMMUNITY): Payer: Self-pay

## 2017-07-08 NOTE — Telephone Encounter (Signed)
She is on a number of medications which may be causing her symptoms. I agree a PCP check-In is a good place to start, and she should come see Dr. Michae KavaAgarwal sooner

## 2017-07-08 NOTE — Telephone Encounter (Signed)
This is a patient of Dr. Michae KavaAgarwal, she was put on Abilify 5 mg in December of 2017. Patient states that gradually over time she has been noticing side effects and they are starting to concern her. She is having blurry vision, dizziness, blurry vision, shaking, incontinence, and confusion. I did advise patient to call PCP and get a referral to Neurology, but she wants to know if Abilify could be the cause. Please review and advise, thank you

## 2017-07-09 NOTE — Telephone Encounter (Signed)
Okay, I called patient to let her know and gave her info to the front desk so they can add her to the cancellation list.

## 2017-07-25 ENCOUNTER — Emergency Department (HOSPITAL_COMMUNITY)
Admission: EM | Admit: 2017-07-25 | Discharge: 2017-07-25 | Discharge status: Against Medical Advice | Payer: PPO | Attending: Emergency Medicine | Admitting: Emergency Medicine

## 2017-07-25 ENCOUNTER — Ambulatory Visit (INDEPENDENT_AMBULATORY_CARE_PROVIDER_SITE_OTHER): Payer: PPO | Admitting: Psychiatry

## 2017-07-25 ENCOUNTER — Encounter (HOSPITAL_COMMUNITY): Payer: Self-pay | Admitting: Emergency Medicine

## 2017-07-25 ENCOUNTER — Encounter (HOSPITAL_COMMUNITY): Payer: Self-pay | Admitting: Psychiatry

## 2017-07-25 DIAGNOSIS — F411 Generalized anxiety disorder: Secondary | ICD-10-CM | POA: Diagnosis not present

## 2017-07-25 DIAGNOSIS — F99 Mental disorder, not otherwise specified: Secondary | ICD-10-CM

## 2017-07-25 DIAGNOSIS — H538 Other visual disturbances: Secondary | ICD-10-CM | POA: Diagnosis not present

## 2017-07-25 DIAGNOSIS — R42 Dizziness and giddiness: Secondary | ICD-10-CM | POA: Diagnosis not present

## 2017-07-25 DIAGNOSIS — F5105 Insomnia due to other mental disorder: Secondary | ICD-10-CM

## 2017-07-25 DIAGNOSIS — F5000 Anorexia nervosa, unspecified: Secondary | ICD-10-CM | POA: Diagnosis not present

## 2017-07-25 DIAGNOSIS — Z818 Family history of other mental and behavioral disorders: Secondary | ICD-10-CM

## 2017-07-25 DIAGNOSIS — Z5321 Procedure and treatment not carried out due to patient leaving prior to being seen by health care provider: Secondary | ICD-10-CM | POA: Insufficient documentation

## 2017-07-25 DIAGNOSIS — F3130 Bipolar disorder, current episode depressed, mild or moderate severity, unspecified: Secondary | ICD-10-CM

## 2017-07-25 MED ORDER — CLONAZEPAM 1 MG PO TABS: 1.0000 mg | ORAL_TABLET | Freq: Two times a day (BID) | ORAL | 0 refills | Status: DC

## 2017-07-25 MED ORDER — TRAZODONE HCL 50 MG PO TABS: 50.0000 mg | ORAL_TABLET | Freq: Every day | ORAL | 0 refills | Status: DC

## 2017-07-25 MED ORDER — ZIPRASIDONE HCL 40 MG PO CAPS: 40.0000 mg | ORAL_CAPSULE | Freq: Every day | ORAL | 0 refills | Status: DC

## 2017-07-25 MED ORDER — FLUOXETINE HCL 40 MG PO CAPS: 80.0000 mg | ORAL_CAPSULE | Freq: Every day | ORAL | 0 refills | Status: DC

## 2017-07-25 MED ORDER — TOPIRAMATE 100 MG PO TABS: ORAL_TABLET | ORAL | 0 refills | Status: DC

## 2017-07-25 MED ORDER — BUPROPION HCL ER (XL) 150 MG PO TB24: 450.0000 mg | ORAL_TABLET | Freq: Every day | ORAL | 0 refills | Status: DC
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
## 2017-07-25 NOTE — ED Notes (Signed)
Called Pt x2 to be roomed, no response. Do not see Pt, outside, bathroom, or lobby.
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
## 2017-07-25 NOTE — ED Notes (Signed)
Patient no answer from lobby or outside

## 2017-07-25 NOTE — ED Notes (Signed)
Called for Pt to be roomed, no response. Do not see Pt in lobby.

## 2017-07-26 ENCOUNTER — Telehealth (HOSPITAL_COMMUNITY): Payer: Self-pay
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
## 2017-08-06 ENCOUNTER — Ambulatory Visit (HOSPITAL_COMMUNITY): Payer: Self-pay | Admitting: Psychology

## 2017-08-15 ENCOUNTER — Observation Stay (HOSPITAL_COMMUNITY): Payer: PPO

## 2017-08-15 ENCOUNTER — Emergency Department (HOSPITAL_COMMUNITY): Payer: PPO

## 2017-08-15 ENCOUNTER — Observation Stay (HOSPITAL_COMMUNITY)
Admission: EM | Admit: 2017-08-15 | Discharge: 2017-08-17 | Disposition: A | Discharge status: Home / Self Care | Payer: PPO | Attending: Internal Medicine | Admitting: Internal Medicine

## 2017-08-15 ENCOUNTER — Encounter (HOSPITAL_COMMUNITY): Payer: Self-pay | Admitting: Emergency Medicine

## 2017-08-15 ENCOUNTER — Ambulatory Visit (INDEPENDENT_AMBULATORY_CARE_PROVIDER_SITE_OTHER): Payer: PPO | Admitting: Psychiatry

## 2017-08-15 ENCOUNTER — Encounter (HOSPITAL_COMMUNITY): Payer: Self-pay | Admitting: Psychiatry

## 2017-08-15 DIAGNOSIS — M6281 Muscle weakness (generalized): Secondary | ICD-10-CM | POA: Diagnosis not present

## 2017-08-15 DIAGNOSIS — R569 Unspecified convulsions: Secondary | ICD-10-CM | POA: Insufficient documentation

## 2017-08-15 DIAGNOSIS — H539 Unspecified visual disturbance: Secondary | ICD-10-CM

## 2017-08-15 DIAGNOSIS — F319 Bipolar disorder, unspecified: Secondary | ICD-10-CM | POA: Diagnosis not present

## 2017-08-15 DIAGNOSIS — F5000 Anorexia nervosa, unspecified: Secondary | ICD-10-CM

## 2017-08-15 DIAGNOSIS — M5126 Other intervertebral disc displacement, lumbar region: Secondary | ICD-10-CM | POA: Diagnosis not present

## 2017-08-15 DIAGNOSIS — R531 Weakness: Secondary | ICD-10-CM | POA: Diagnosis not present

## 2017-08-15 DIAGNOSIS — Z818 Family history of other mental and behavioral disorders: Secondary | ICD-10-CM | POA: Diagnosis not present

## 2017-08-15 DIAGNOSIS — M50221 Other cervical disc displacement at C4-C5 level: Secondary | ICD-10-CM | POA: Diagnosis not present

## 2017-08-15 DIAGNOSIS — H538 Other visual disturbances: Secondary | ICD-10-CM | POA: Diagnosis not present

## 2017-08-15 DIAGNOSIS — F411 Generalized anxiety disorder: Secondary | ICD-10-CM

## 2017-08-15 DIAGNOSIS — R29898 Other symptoms and signs involving the musculoskeletal system: Secondary | ICD-10-CM | POA: Diagnosis present

## 2017-08-15 DIAGNOSIS — F99 Mental disorder, not otherwise specified: Secondary | ICD-10-CM

## 2017-08-15 DIAGNOSIS — Z79899 Other long term (current) drug therapy: Secondary | ICD-10-CM | POA: Diagnosis not present

## 2017-08-15 DIAGNOSIS — F3130 Bipolar disorder, current episode depressed, mild or moderate severity, unspecified: Secondary | ICD-10-CM | POA: Diagnosis not present

## 2017-08-15 DIAGNOSIS — G4700 Insomnia, unspecified: Secondary | ICD-10-CM | POA: Diagnosis not present

## 2017-08-15 DIAGNOSIS — F5105 Insomnia due to other mental disorder: Secondary | ICD-10-CM

## 2017-08-15 DIAGNOSIS — R450 Nervousness: Secondary | ICD-10-CM

## 2017-08-15 DIAGNOSIS — F310 Bipolar disorder, current episode hypomanic: Secondary | ICD-10-CM | POA: Diagnosis present

## 2017-08-15 DIAGNOSIS — R61 Generalized hyperhidrosis: Secondary | ICD-10-CM | POA: Diagnosis not present

## 2017-08-15 DIAGNOSIS — M5124 Other intervertebral disc displacement, thoracic region: Secondary | ICD-10-CM | POA: Diagnosis not present

## 2017-08-15 LAB — CBC WITH DIFFERENTIAL/PLATELET
BASOS ABS: 0 10*3/uL (ref 0.0–0.1)
BASOS PCT: 0 %
EOS PCT: 1 %
Eosinophils Absolute: 0.1 10*3/uL (ref 0.0–0.7)
HEMATOCRIT: 34.9 % — AB (ref 36.0–46.0)
Hemoglobin: 11.5 g/dL — ABNORMAL LOW (ref 12.0–15.0)
LYMPHS PCT: 31 %
Lymphs Abs: 1.7 10*3/uL (ref 0.7–4.0)
MCH: 29.5 pg (ref 26.0–34.0)
MCHC: 33 g/dL (ref 30.0–36.0)
MCV: 89.5 fL (ref 78.0–100.0)
MONO ABS: 0.3 10*3/uL (ref 0.1–1.0)
MONOS PCT: 6 %
NEUTROS ABS: 3.4 10*3/uL (ref 1.7–7.7)
NEUTROS PCT: 62 %
Platelets: 265 10*3/uL (ref 150–400)
RBC: 3.9 MIL/uL (ref 3.87–5.11)
RDW: 13.8 % (ref 11.5–15.5)
WBC: 5.5 10*3/uL (ref 4.0–10.5)

## 2017-08-15 LAB — I-STAT BETA HCG BLOOD, ED (MC, WL, AP ONLY): I-stat hCG, quantitative: <5 m[IU]/mL (ref <5)

## 2017-08-15 LAB — TSH: TSH: 2.955 u[IU]/mL (ref 0.350–4.500)

## 2017-08-15 LAB — ETHANOL

## 2017-08-15 LAB — COMPREHENSIVE METABOLIC PANEL
ALBUMIN: 3.6 g/dL (ref 3.5–5.0)
ALK PHOS: 29 U/L — AB (ref 38–126)
ALT: 17 U/L (ref 14–54)
AST: 16 U/L (ref 15–41)
Anion gap: 8 (ref 5–15)
BUN: 20 mg/dL (ref 6–20)
CO2: 20 mmol/L — ABNORMAL LOW (ref 22–32)
CREATININE: 1.06 mg/dL — AB (ref 0.44–1.00)
Calcium: 8.8 mg/dL — ABNORMAL LOW (ref 8.9–10.3)
Chloride: 109 mmol/L (ref 101–111)
GFR calc Af Amer: >60 mL/min (ref >60)
GLUCOSE: 82 mg/dL (ref 65–99)
Potassium: 3.6 mmol/L (ref 3.5–5.1)
Sodium: 137 mmol/L (ref 135–145)
TOTAL PROTEIN: 6.5 g/dL (ref 6.5–8.1)
Total Bilirubin: 0.5 mg/dL (ref 0.3–1.2)

## 2017-08-15 LAB — ACETAMINOPHEN LEVEL: Acetaminophen (Tylenol), Serum: <10 ug/mL — ABNORMAL LOW (ref 10–30)

## 2017-08-15 LAB — SALICYLATE LEVEL: Salicylate Lvl: <7 mg/dL (ref 2.8–30.0)

## 2017-08-15 MED ORDER — ENOXAPARIN SODIUM 40 MG/0.4ML ~~LOC~~ SOLN
40.0000 mg | SUBCUTANEOUS | Status: DC
  Administered 2017-08-16: 40 mg via SUBCUTANEOUS
  Filled 2017-08-15 (×2): qty 0.4

## 2017-08-15 MED ORDER — TOPIRAMATE 100 MG PO TABS
100.0000 mg | ORAL_TABLET | Freq: Every day | ORAL | Status: DC
  Administered 2017-08-16 – 2017-08-17 (×2): 100 mg via ORAL
  Filled 2017-08-15 (×2): qty 1

## 2017-08-15 MED ORDER — ZIPRASIDONE HCL 40 MG PO CAPS
40.0000 mg | ORAL_CAPSULE | Freq: Every day | ORAL | Status: DC
  Administered 2017-08-15 – 2017-08-16 (×2): 40 mg via ORAL
  Filled 2017-08-15 (×2): qty 1

## 2017-08-15 MED ORDER — GADOBENATE DIMEGLUMINE 529 MG/ML IV SOLN
10.0000 mL | Freq: Once | INTRAVENOUS | Status: AC | PRN
  Administered 2017-08-15: 8 mL via INTRAVENOUS

## 2017-08-15 MED ORDER — NORGESTIM-ETH ESTRAD TRIPHASIC 0.18/0.215/0.25 MG-25 MCG PO TABS: 1.0000 | ORAL_TABLET | Freq: Every day | ORAL | Status: DC

## 2017-08-15 MED ORDER — FLUOXETINE HCL 10 MG PO CAPS
80.0000 mg | ORAL_CAPSULE | Freq: Every day | ORAL | Status: DC
  Administered 2017-08-16 – 2017-08-17 (×2): 80 mg via ORAL
  Filled 2017-08-15 (×2): qty 8

## 2017-08-15 MED ORDER — BUPROPION HCL ER (XL) 300 MG PO TB24
450.0000 mg | ORAL_TABLET | Freq: Every day | ORAL | Status: DC
  Administered 2017-08-16 – 2017-08-17 (×2): 450 mg via ORAL
  Filled 2017-08-15 (×2): qty 1

## 2017-08-15 MED ORDER — ENSURE ENLIVE PO LIQD
237.0000 mL | Freq: Two times a day (BID) | ORAL | Status: DC
  Administered 2017-08-16 – 2017-08-17 (×2): 237 mL via ORAL

## 2017-08-15 MED ORDER — CLONAZEPAM 1 MG PO TABS
1.0000 mg | ORAL_TABLET | Freq: Every day | ORAL | Status: DC
  Administered 2017-08-15 – 2017-08-16 (×2): 1 mg via ORAL
  Filled 2017-08-15 (×2): qty 1

## 2017-08-15 MED ORDER — TRAZODONE HCL 50 MG PO TABS
50.0000 mg | ORAL_TABLET | Freq: Every day | ORAL | Status: DC
  Administered 2017-08-15 – 2017-08-16 (×2): 50 mg via ORAL
  Filled 2017-08-15 (×2): qty 1

## 2017-08-15 NOTE — ED Notes (Signed)
Bed: WLPT3 Expected date:  Expected time:  Means of arrival:  Comments: 

## 2017-08-15 NOTE — ED Notes (Signed)
Bed: WTR5 Expected date:  Expected time:  Means of arrival:  Comments: 

## 2017-08-15 NOTE — ED Notes (Signed)
After giving report to Benay Pillow, who expressed no concerns over pt being brought upstairs-Terry, Consulting civil engineer received a call from [employer]	[place] expressing concerns about bringing this patient up. After calling Charge RN Tom from 5E to inquire about concerns in regards to the pt, he answered no questions and handed the phone back to [place]- who again expressed no concerns over the pt coming up. [facility] has been made aware.
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
## 2017-08-15 NOTE — ED Provider Notes (Signed)
[facility]-EMERGENCY DEPT Provider Note   CSN: [id number] Arrival date & time: 08/15/17  1542     History   Chief Complaint Chief Complaint  Patient presents with  . possible MS    HPI [name] is a 46 y.o. female.  HPI   [name] is a 46 y.o. female, with a history of bipolar and thyroid disease, presenting to the ED with bedwetting, tremors, and vision changes beginning about 9 months ago.  States the tremors are constant and "it's like I'm shivering and nervous."  Lower extremity weakness: Has had multiple falls due to lower extremity weakness over the last 9 months. This typically happens going up or down stairs or in the bathtub. Last fall was in the bathtub about two months ago. Vision changes: Her eye glasses prescription has changed three times over the last four months. Sees Dr. Lucina Mellow on [address] In [place]. States her vision will get worse and then better and then worse again, but the changes happen gradually.  Urinary incontinence: Endorses enuresis and night sweats most nights. No incontinence or diaphoresis when awake.  Generalized weakness and fatigue: States, "Everything is just such a chore. I don't want to do anything that I normally would like to do. I don't want to see my friends or anyone else. I'm just so tired. I used to be a happy person."  "I also will sit an stare for hours. I will just sit in one place and before I know it, three hours has gone by." Adds, "I have bipolar and I am familiar with that depression, but this is something different. I saw my psychiatrist today and she was worried. She told me I had had a stroke and needed to come to [facility] right away."  Psychiatrist: Dr. Alena Bills at [facility].  Denies recent medication changes. LMP around 08/04/17 and still regular. Denies SI/HI. Denies A/V hallucinations. Last Mammogram was Oct 2017 and was normal. Has an appt to get another one in Oct 2018. Has a PCP appt with [facility] on Tuesday 9/25.   Denies headaches, fever/chills, CP, SOB, swallow dysfunction, cough, abdominal pain, urinary discomfort, changes in her breasts, pain anywhere, or any other complaints.   Past Medical History:  Diagnosis Date  . Bipolar 1 disorder (HCC)   . Eating disorder   . Thyroid disease     Patient Active Problem List   Diagnosis Date Noted  . Bipolar I disorder, most recent episode depressed (HCC) 08/16/2015  . Insomnia 08/16/2015  . Bipolar I disorder, current or most recent episode hypomanic (HCC) 03/03/2015  . GAD (generalized anxiety disorder) 09/30/2014  . Anorexia nervosa 11/18/2013  . Bipolar I disorder, most recent episode (or current) depressed, unspecified 10/28/2013    Past Surgical History:  Procedure Laterality Date  . LEEP    . MULTIPLE TOOTH EXTRACTIONS    . TYMPANOSTOMY TUBE PLACEMENT      OB History    No data available       Home Medications    Prior to Admission medications   Medication Sig Start Date End Date Taking? Authorizing Provider  buPROPion (WELLBUTRIN XL) 150 MG 24 hr tablet Take 3 tablets (450 mg total) by mouth daily. 07/25/17   Oletta Darter, MD  clonazePAM (KLONOPIN) 1 MG tablet Take 1 tablet (1 mg total) by mouth 2 (two) times daily. 07/25/17 07/25/18  Oletta Darter, MD  FLUoxetine (PROZAC) 40 MG capsule Take 2 capsules (80 mg total)  by mouth daily. 07/25/17 07/25/18  Oletta Darter, MD  Melatonin 10 MG TBDP Take 5 mg by mouth at bedtime as needed.    [provider]  Norgestimate-Ethinyl Estradiol Triphasic (ORTHO TRI-CYCLEN LO) 0.18/0.215/0.25 MG-25 MCG tab Take 1 tablet by mouth daily.    [provider]  topiramate (TOPAMAX) 100 MG tablet TAKE 1 BY MOUTH DAILY 07/25/17   Oletta Darter, MD  traZODone (DESYREL) 50 MG tablet Take 1 tablet (50 mg total) by mouth at bedtime. 07/25/17   Oletta Darter, MD  ziprasidone (GEODON) 40 MG capsule Take 1 capsule (40 mg total) by mouth at bedtime. 07/25/17    Oletta Darter, MD    Family History Family History  Problem Relation Age of Onset  . Schizophrenia Father   . Bipolar disorder Father   . Schizophrenia Paternal Grandmother   . Breast cancer Mother 44  . Pancreatic cancer Maternal Aunt   . Schizophrenia Paternal Grandfather   . Heart attack Maternal Grandfather   . Suicidality Neg Hx     Social History Social History  Substance Use Topics  . Smoking status: Never Smoker  . Smokeless tobacco: Never Used  . Alcohol use No     Allergies   Latuda [lurasidone hcl] and Sulfa antibiotics   Review of Systems Review of Systems  Constitutional: Positive for activity change, diaphoresis (night time) and fatigue. Negative for fever.  HENT: Negative for drooling and trouble swallowing.   Respiratory: Negative for shortness of breath.   Cardiovascular: Negative for chest pain.  Gastrointestinal: Negative for abdominal pain, constipation, diarrhea, nausea and vomiting.  Genitourinary:       Enuresis  Musculoskeletal: Negative for back pain and neck pain.  Neurological: Positive for weakness (lower extremities, intermittent). Negative for dizziness, syncope, facial asymmetry, light-headedness and headaches.  Psychiatric/Behavioral: Negative for confusion, hallucinations and suicidal ideas.  All other systems reviewed and are negative.    Physical Exam Updated Vital Signs BP 102/63 (BP Location: Left Arm)   Pulse 78   Temp 98 F (36.7 C) (Oral)   Resp 18   SpO2 100%   Physical Exam  Constitutional: She is oriented to person, place, and time. She appears well-developed and well-nourished. No distress.  HENT:  Head: Normocephalic and atraumatic.  Mouth/Throat: Oropharynx is clear and moist.  Eyes: Pupils are equal, round, and reactive to light. Conjunctivae and EOM are normal.  Neck: Normal range of motion. Neck supple.  Cardiovascular: Normal rate, regular rhythm, normal heart sounds and intact distal pulses.     Pulmonary/Chest: Effort normal and breath sounds normal. No respiratory distress.  Abdominal: Soft. There is no tenderness. There is no guarding.  Musculoskeletal: She exhibits no edema.  Lymphadenopathy:    She has no cervical adenopathy.  Neurological: She is alert and oriented to person, place, and time. She displays tremor.  No sensory deficits.  Resting and intention tremor No noted speech deficits. No aphasia. Patient handles oral secretions without difficulty. No noted swallowing defects.  Equal grip strength bilaterally. Strength 5/5 in the upper extremities. Strength 5/5 with flexion and extension of the hips, knees, and ankles bilaterally.  Patellar DTRs 2+ bilaterally Negative Romberg. No gait disturbance.  Coordination intact including heel to shin and finger to nose.  Cranial nerves III-XII grossly intact.  No facial droop.   Skin: Skin is warm and dry. Capillary refill takes less than 2 seconds. She is not diaphoretic.  Psychiatric: She has a normal mood and affect. Her behavior is normal.  Nursing  note and vitals reviewed.    ED Treatments / Results  Labs (all labs ordered are listed, but only abnormal results are displayed) Labs Reviewed  COMPREHENSIVE METABOLIC PANEL - Abnormal; Notable for the following:       Result Value   CO2 20 (*)    Creatinine, Ser 1.06 (*)    Calcium 8.8 (*)    Alkaline Phosphatase 29 (*)    All other components within normal limits  ACETAMINOPHEN LEVEL - Abnormal; Notable for the following:    Acetaminophen (Tylenol), Serum <10 (*)    All other components within normal limits  CBC WITH DIFFERENTIAL/PLATELET - Abnormal; Notable for the following:    Hemoglobin 11.5 (*)    HCT 34.9 (*)    All other components within normal limits  ETHANOL  SALICYLATE LEVEL  TSH  RAPID URINE DRUG SCREEN, HOSP PERFORMED  URINALYSIS, ROUTINE W REFLEX MICROSCOPIC  I-STAT BETA HCG BLOOD, ED ([facility], [facility], [facility] ONLY)    EKG  EKG Interpretation None        Radiology Dg Chest 2 View  Result Date: 08/15/2017 CLINICAL DATA:  Night sweats. EXAM: CHEST  2 VIEW COMPARISON:  None. FINDINGS: The heart size and mediastinal contours are within normal limits. Both lungs are clear. The visualized skeletal structures are unremarkable. IMPRESSION: No active cardiopulmonary disease. Electronically Signed   By: Lupita Raider, M.D.   On: 08/15/2017 18:49   Ct Head Wo Contrast  Result Date: 08/15/2017 CLINICAL DATA:  Visual disturbance, incontinence, tremor, anxiety EXAM: CT HEAD WITHOUT CONTRAST TECHNIQUE: Contiguous axial images were obtained from the base of the skull through the vertex without intravenous contrast. COMPARISON:  None available FINDINGS: Brain: No evidence of acute infarction, hemorrhage, hydrocephalus, extra-axial collection or mass lesion/mass effect. Vascular: No hyperdense vessel or unexpected calcification. Skull: Normal. Negative for fracture or focal lesion. Sinuses/Orbits: No acute finding. Other: None. IMPRESSION: Normal head CT without contrast Electronically Signed   By: Judie Petit.  Shick M.D.   On: 08/15/2017 18:21    Procedures Procedures (including critical care time)  Medications Ordered in ED Medications - No data to display   Initial Impression / Assessment and Plan / ED Course  I have reviewed the triage vital signs and the nursing notes.  Pertinent labs & imaging results that were available during my care of the patient were reviewed by me and considered in my medical decision making (see chart for details).  Clinical Course as of Aug 16 1935  Thu Aug 15, 2017  1850 Spoke with Dr. Wilford Corner, neurologist, for advice. Recommends admission here at [facility] due to bed status at [facility]. MR of brain and spine with and without contrast as well as an EEG. Dr. Wilford Corner is not officially consulting on this patient and if hospitalist needs this consult, they will have to place an official consult request.  [SJ]  [phone] Spoke with Dr.  Mariea Clonts, hospitalist. Agrees to admit the patient.  [SJ]    Clinical Course User Index [SJ] Sandrika Schwinn C, PA-C    Patient's constellation of symptoms gives concern for possible central neurologic lesion and would benefit from further evaluation. Admission due to this workup being unable to be logically completed in the ED.   Final Clinical Impressions(s) / ED Diagnoses   Final diagnoses:  Weakness of both lower extremities  Vision changes    New Prescriptions New Prescriptions   No medications on file     Concepcion Living 08/15/17 1936  Mesner, Barbara Cower, MD 08/16/17 [phone]

## 2017-08-15 NOTE — ED Notes (Signed)
Pt transported to MRI and will be gone about three hours

## 2017-08-15 NOTE — ED Triage Notes (Addendum)
Pt complaint of tremor, worsening vision, peeing in the bed, and staring off in space for hours, and exhaustion worsening over past month. Pt appears anxious with triage. "I don't feel like me anymore; I don't want to go anywhere anymore." Pt denies SI/HI or A/VH.

## 2017-08-16 ENCOUNTER — Observation Stay (HOSPITAL_BASED_OUTPATIENT_CLINIC_OR_DEPARTMENT_OTHER)
Admit: 2017-08-16 | Discharge: 2017-08-16 | Disposition: A | Discharge status: Home / Self Care | Payer: PPO | Attending: Emergency Medicine | Admitting: Emergency Medicine

## 2017-08-16 DIAGNOSIS — R29898 Other symptoms and signs involving the musculoskeletal system: Secondary | ICD-10-CM | POA: Diagnosis not present

## 2017-08-16 DIAGNOSIS — R4189 Other symptoms and signs involving cognitive functions and awareness: Secondary | ICD-10-CM

## 2017-08-16 LAB — URINALYSIS, ROUTINE W REFLEX MICROSCOPIC
BILIRUBIN URINE: NEGATIVE
Glucose, UA: NEGATIVE mg/dL
Hgb urine dipstick: NEGATIVE
Ketones, ur: NEGATIVE mg/dL
LEUKOCYTES UA: NEGATIVE
NITRITE: NEGATIVE
PH: 6 (ref 5.0–8.0)
Protein, ur: NEGATIVE mg/dL
SPECIFIC GRAVITY, URINE: 1.01 (ref 1.005–1.030)

## 2017-08-16 LAB — RAPID URINE DRUG SCREEN, HOSP PERFORMED
AMPHETAMINES: NOT DETECTED
BENZODIAZEPINES: NOT DETECTED
Barbiturates: NOT DETECTED
COCAINE: NOT DETECTED
Opiates: NOT DETECTED
Tetrahydrocannabinol: NOT DETECTED

## 2017-08-16 MED ORDER — LOPERAMIDE HCL 2 MG PO CAPS: 2.0000 mg | ORAL_CAPSULE | ORAL | Status: DC | PRN

## 2017-08-16 MED ORDER — ACETAMINOPHEN 325 MG PO TABS
650.0000 mg | ORAL_TABLET | Freq: Four times a day (QID) | ORAL | Status: DC | PRN
  Filled 2017-08-16: qty 2

## 2017-08-16 NOTE — Evaluation (Signed)
Physical Therapy Evaluation Patient Details Name: [name] MRN: [id number] DOB: 16-Oct-1971 Today's Date: 08/16/2017   History of Present Illness  46 year old Caucasian female with history significant for generalized anxiety disorder, bipolar disorder and admitted for Bilateral lower extremity weakness- With multiple complaints appears all unrelated-no specific neurological issues  Clinical Impression  Patient evaluated by Physical Therapy with no further acute PT needs identified. All education has been completed and the patient has no further questions.  Pt mobilizing at baseline and no specific deficits observed.  Pt reports she has trouble with weakness however upon further questions, appears she reports more of extreme fatigue.  Pt reports frequent napping and also needing to lay down after walking her dog 20 min (which she reports she performs about 3 times a day). See below for any follow-up Physical Therapy or equipment needs. PT is signing off. Thank you for this referral.     Follow Up Recommendations No PT follow up    Equipment Recommendations  None recommended by PT    Recommendations for Other Services       Precautions / Restrictions Precautions Precautions: Fall      Mobility  Bed Mobility Overal bed mobility: Modified Independent                Transfers Overall transfer level: Modified independent                  Ambulation/Gait Ambulation/Gait assistance: Supervision;Modified independent (Device/Increase time) Ambulation Distance (Feet): 400 Feet Assistive device: None Gait Pattern/deviations: WFL(Within Functional Limits)     General Gait Details: WFL, no deficits observed  Stairs            Wheelchair Mobility    Modified Rankin (Stroke Patients Only)       Balance Overall balance assessment: Modified Independent                                           Pertinent Vitals/Pain Pain Assessment:  No/denies pain    Home Living Family/patient expects to be discharged to:: Private residence Living Arrangements: Spouse/significant other           Home Layout: Able to live on main level with bedroom/bathroom Home Equipment: None      Prior Function Level of Independence: Independent               Hand Dominance        Extremity/Trunk Assessment   Upper Extremity Assessment Upper Extremity Assessment: Overall WFL for tasks assessed    Lower Extremity Assessment Lower Extremity Assessment: Overall WFL for tasks assessed    Cervical / Trunk Assessment Cervical / Trunk Assessment: Normal  Communication   Communication: No difficulties  Cognition Arousal/Alertness: Awake/alert Behavior During Therapy: WFL for tasks assessed/performed Overall Cognitive Status: Within Functional Limits for tasks assessed                                        General Comments      Exercises     Assessment/Plan    PT Assessment Patent does not need any further PT services  PT Problem List         PT Treatment Interventions      PT Goals (Current goals can be found in the Care Plan section)  Acute Rehab PT Goals PT Goal Formulation: All assessment and education complete, DC therapy    Frequency     Barriers to discharge        Co-evaluation               AM-PAC PT "6 Clicks" Daily Activity  Outcome Measure Difficulty turning over in bed (including adjusting bedclothes, sheets and blankets)?: None Difficulty moving from lying on back to sitting on the side of the bed? : None Difficulty sitting down on and standing up from a chair with arms (e.g., wheelchair, bedside commode, etc,.)?: None Help needed moving to and from a bed to chair (including a wheelchair)?: None Help needed walking in hospital room?: None Help needed climbing 3-5 steps with a railing? : None 6 Click Score: 24    End of Session   Activity Tolerance: Patient  tolerated treatment well Patient left: in bed;with bed alarm set;with call bell/phone within reach   PT Visit Diagnosis: Muscle weakness (generalized) (M62.81)    Time: 1610-9604 PT Time Calculation (min) (ACUTE ONLY): 12 min   Charges:   PT Evaluation $PT Eval Low Complexity: 1 Low     PT G Codes:   PT G-Codes **NOT FOR INPATIENT CLASS** Functional Assessment Tool Used: AM-PAC 6 Clicks Basic Mobility;Clinical judgement Functional Limitation: Mobility: Walking and moving around Mobility: Walking and Moving Around Current Status (V4098): 0 percent impaired, limited or restricted Mobility: Walking and Moving Around Goal Status (J1914): 0 percent impaired, limited or restricted Mobility: Walking and Moving Around Discharge Status (N8295): 0 percent impaired, limited or restricted   Zenovia Jarred, PT, DPT 08/16/2017 Pager: [phone]   Maida Sale E 08/16/2017, 3:22 PM

## 2017-08-16 NOTE — Progress Notes (Signed)
EEG completed; results pending.    

## 2017-08-16 NOTE — Progress Notes (Signed)
SEIZURE PRECAUTIONS-- unable to place seizure pads on pts bed as the hospital does not have a set available

## 2017-08-16 NOTE — Discharge Instructions (Signed)
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
## 2017-08-16 NOTE — Procedures (Signed)
ELECTROENCEPHALOGRAM REPORT  Date of Study: 08/16/2017  Patient's Name: [name] MRN: [id number] Date of Birth: 03-01-1971  Referring Provider: Anselm Pancoast, PA-C  Clinical History: 46 year old woman with Bipolar disorder and generalized anxiety disorder who presents with multiple neurologic complaints, including staring spells.  Medications: buPROPion (WELLBUTRIN XL) 24 hr tablet 450 mg clonazePAM (KLONOPIN) tablet 1 mg  FLUoxetine (PROZAC) capsule 80 mg  Norgestimate-Ethinyl Estradiol Triphasic 0.18/0.215/0.25 MG-25 MCG tablet 1 tablet  topiramate (TOPAMAX) tablet 100 mg  traZODone (DESYREL) tablet 50 mg  ziprasidone (GEODON) capsule 40 mg   Technical Summary: A multichannel digital EEG recording measured by the international 10-20 system with electrodes applied with paste and impedances below 5000 ohms performed in our laboratory with EKG monitoring in an awake and asleep patient.  Hyperventilation and photic stimulation were performed.  The digital EEG was referentially recorded, reformatted, and digitally filtered in a variety of bipolar and referential montages for optimal display.    Description: The patient is awake and asleep during the recording.  During maximal wakefulness, there is a symmetric, medium voltage 9 Hz posterior dominant rhythm that attenuates with eye opening.  The record is symmetric.  During drowsiness and sleep, there is an increase in theta slowing of the background.  Vertex waves and symmetric sleep spindles were seen.  Hyperventilation and photic stimulation did not elicit any abnormalities.  Diffuse low-voltage excess beta activity is seen throughout the recording.  There were no epileptiform discharges or electrographic seizures seen.    EKG lead was unremarkable.  Impression: This awake and asleep EEG is normal except for excess amount of diffuse low voltage beta activity.  Clinical Correlation: Diffuse low voltage beta activity is commonly seen  with sedating medications such as benzodiazepines.  In the absence of sedating medications, anxiety and hyperthyroidism may produce generalized beta activity.  The absence of epileptiform discharges does not exclude a clinical diagnosis of epilepsy.  If further clinical questions remain, prolonged EEG may be helpful.  Clinical correlation is advised.  Adam R. Everlena Cooper, DO

## 2017-08-16 NOTE — Progress Notes (Signed)
PROGRESS NOTE    Raechel Debanhi Blaker  ZOX:[id number] DOB: Oct 10, 1971 DOA: 08/15/2017 PCP: Deatra James, MD (Confirm with patient/family/NH records and if not entered, this HAS to be entered at [facility] point of entry. "No PCP" if truly none.)   Brief Narrative: This is a 46 year old Caucasian female with history significant for generalized anxiety disorder, bipolar disorder who was sent to the emergency department from her psychiatrist's office with noted visual changes, bilateral lower extremity weakness, bedwetting, night sweats, tremors, and staring off into space. She has thus far undergone MRI of the brain and spine with no significant findings. EEG demonstrates beta wave activity consistent with excessive benzodiazepine use, but no epileptiform activity is noted. PT evaluation is currently pending.  The patient was seen and evaluated this morning and does complain of some diarrhea that began early this morning. She denies any bleeding or abdominal pain. No other acute complaints or concerns have been noted overnight.  Assessment & Plan:   Principal Problem:   Lower extremity weakness Active Problems:   GAD (generalized anxiety disorder)   Bipolar I disorder, current or most recent episode hypomanic (HCC)   Insomnia   Bilateral lower extremity weakness- With multiple complaints appears all unrelated-no specific neurological issues noted today. - PT eval and treat pending - Psychiatry consultation ordered and pending; she'll will require likely medication adjustment -AM labs  Seizure history- no seizures since age 59. - Continue home Topamax - EEG with no epileptiform activity noted, but may need prolonged evaluation if symptoms persist and psych eval WNL  BPD- follows with Dr. Michae Kava.  - continue home ziprasidone trazodone Prozac - Continue home Klonopin 1 mg daily at bedtime for insomnia and anxiety   DVT prophylaxis: Lovenox Code Status: Full Family Communication: w/  patient Disposition Plan: Home vs geri-psych in 1-2 days   Consultants:   Psychiatry to consult   Procedures: None  Antimicrobials: None  Objective: Vitals:   08/15/17 2228 08/15/17 2259 08/15/17 2300 08/16/17 0532  BP: 109/62 (!) 105/47  (!) 84/46  Pulse: 68 74  69  Resp: 15     Temp:  97.7 F (36.5 C)  (!) 97.4 F (36.3 C)  TempSrc:  Oral  Oral  SpO2: 99% 100%  100%  Weight:   40.1 kg (88 lb 6.4 oz)   Height:   5' (1.524 m)     Intake/Output Summary (Last 24 hours) at 08/16/17 1248 Last data filed at 08/16/17 0800  Gross per 24 hour  Intake              480 ml  Output                0 ml  Net              480 ml   Filed Weights   08/15/17 2300  Weight: 40.1 kg (88 lb 6.4 oz)    Examination:  General exam: Appears calm and comfortable  Respiratory system: Clear to auscultation. Respiratory effort normal. Cardiovascular system: S1 & S2 heard, RRR. No JVD, murmurs, rubs, gallops or clicks. No pedal edema. Gastrointestinal system: Abdomen is nondistended, soft and nontender. No organomegaly or masses felt. Normal bowel sounds heard. Central nervous system: Alert and oriented. No focal neurological deficits. Extremities: Symmetric 5 x 5 power. Skin: No rashes, lesions or ulcers Psychiatry: Judgement and insight appear normal. Mood & affect appropriate.     Data Reviewed: I have personally reviewed following labs and imaging studies  CBC:  Recent  Labs Lab 08/15/17 1726  WBC 5.5  NEUTROABS 3.4  HGB 11.5*  HCT 34.9*  MCV 89.5  PLT 265   Basic Metabolic Panel:  Recent Labs Lab 08/15/17 1726  NA 137  K 3.6  CL 109  CO2 20*  GLUCOSE 82  BUN 20  CREATININE 1.06*  CALCIUM 8.8*   GFR: Estimated Creatinine Clearance: 41.1 mL/min (A) (by C-G formula based on SCr of 1.06 mg/dL (H)). Liver Function Tests:  Recent Labs Lab 08/15/17 1726  AST 16  ALT 17  ALKPHOS 29*  BILITOT 0.5  PROT 6.5  ALBUMIN 3.6   No results for input(s): LIPASE,  AMYLASE in the last 168 hours. No results for input(s): AMMONIA in the last 168 hours. Coagulation Profile: No results for input(s): INR, PROTIME in the last 168 hours. Cardiac Enzymes: No results for input(s): CKTOTAL, CKMB, CKMBINDEX, TROPONINI in the last 168 hours. BNP (last 3 results) No results for input(s): PROBNP in the last 8760 hours. HbA1C: No results for input(s): HGBA1C in the last 72 hours. CBG: No results for input(s): GLUCAP in the last 168 hours. Lipid Profile: No results for input(s): CHOL, HDL, LDLCALC, TRIG, CHOLHDL, LDLDIRECT in the last 72 hours. Thyroid Function Tests:  Recent Labs  08/15/17 1726  TSH 2.955   Anemia Panel: No results for input(s): VITAMINB12, FOLATE, FERRITIN, TIBC, IRON, RETICCTPCT in the last 72 hours. Sepsis Labs: No results for input(s): PROCALCITON, LATICACIDVEN in the last 168 hours.  No results found for this or any previous visit (from the past 240 hour(s)).       Radiology Studies: Dg Chest 2 View  Result Date: 08/15/2017 CLINICAL DATA:  Night sweats. EXAM: CHEST  2 VIEW COMPARISON:  None. FINDINGS: The heart size and mediastinal contours are within normal limits. Both lungs are clear. The visualized skeletal structures are unremarkable. IMPRESSION: No active cardiopulmonary disease. Electronically Signed   By: Lupita Raider, M.D.   On: 08/15/2017 18:49   Ct Head Wo Contrast  Result Date: 08/15/2017 CLINICAL DATA:  Visual disturbance, incontinence, tremor, anxiety EXAM: CT HEAD WITHOUT CONTRAST TECHNIQUE: Contiguous axial images were obtained from the base of the skull through the vertex without intravenous contrast. COMPARISON:  None available FINDINGS: Brain: No evidence of acute infarction, hemorrhage, hydrocephalus, extra-axial collection or mass lesion/mass effect. Vascular: No hyperdense vessel or unexpected calcification. Skull: Normal. Negative for fracture or focal lesion. Sinuses/Orbits: No acute finding. Other: None.  IMPRESSION: Normal head CT without contrast Electronically Signed   By: Judie Petit.  Shick M.D.   On: 08/15/2017 18:21   Mr Brain W And Wo Contrast  Result Date: 08/15/2017 CLINICAL DATA:  Initial evaluation for acute tremors, vision changes EXAM: MRI TOTAL SPINE WITHOUT AND WITH CONTRAST TECHNIQUE: Multisequence MR imaging of the brain and surrounding structures was performed prior to and following contrast administration. Multisequence MR imaging of the spine from the cervical spine to the sacrum was performed prior to and following IV contrast administration for evaluation of spinal metastatic disease. CONTRAST:  8mL MULTIHANCE GADOBENATE DIMEGLUMINE 529 MG/ML IV SOLN COMPARISON:  Prior head CT from earlier the same day. FINDINGS: MRI HEAD FINDINGS Brain: Mild age-related cerebral volume loss. No focal parenchymal signal abnormality. No significant cerebral white matter disease for age. No abnormal foci of restricted diffusion to suggest acute or subacute ischemia. No encephalomalacia to suggest chronic infarction. No solid artifact suggest acute or chronic intracranial hemorrhage. No mass lesion, midline shift or mass effect. No hydrocephalus. No extra-axial fluid collection. Major  dural sinuses patent. No abnormal enhancement. Pituitary gland suprasellar region normal. Midline structures intact and normal. Vascular:  Major intracranial vascular flow voids well maintained. Skull base, calvarium, and soft tissues: Craniocervical junction normal. Bone marrow signal intensity within normal limits. Scalp soft tissues unremarkable. Orbits/sinues: Globes and orbital soft tissues within normal limits. Paranasal sinuses are clear. No mastoid effusion. Inner ear structures normal. MRI CERVICAL SPINE FINDINGS Alignment: Vertebral bodies normally aligned with preservation of the normal cervical lordosis. No listhesis. Vertebrae: Vertebral body heights well maintained. No evidence for acute or chronic fracture. Bone marrow  signal intensity within normal limits. No discrete or worrisome osseous lesions. No abnormal marrow edema or enhancement. Cord: Signal intensity within the cervical spinal cord is normal. Posterior Fossa, vertebral arteries, paraspinal tissues: Paraspinous soft tissues within normal limits. Normal intravascular flow voids present within the vertebral arteries bilaterally. Disc levels: C2-C3: Unremarkable. C3-C4:  Mild degenerative disc bulge.  No stenosis. C4-C5: Shallow central disc protrusion mildly indents the ventral thecal sac. No significant canal or foraminal stenosis. C5-C6:  Unremarkable. C6-C7:  Unremarkable. C7-T1:  Unremarkable. MRI THORACIC SPINE FINDINGS Alignment: Vertebral bodies normally aligned with preservation of the normal thoracic kyphosis. No listhesis. Vertebrae: Vertebral body heights well maintained. No evidence for acute or chronic fracture. Signal intensity within the vertebral body bone marrow is normal. Few scattered benign hemangiomas noted, most notable within the anterior aspect of the T4 vertebral body. No worrisome osseous lesions. No abnormal marrow edema or enhancement. Cord:  Signal intensity within the thoracic spinal cord is normal. Paraspinal and other soft tissues: Paraspinous soft tissues within normal limits. Partially visualized lungs are grossly clear. Visualized visceral structures unremarkable. Disc levels: T6-7: Small right paracentral disc protrusion flattens the right ventral thecal sac (series 24, image 20). Associated osseous ridging. No significant stenosis. T8-9: Left paracentral disc protrusion indents the left ventral thecal sac (series 24, image 25). No significant stenosis. No other significant degenerative changes within the thoracic spine. No canal or foraminal stenosis. MRI LUMBAR SPINE FINDINGS Segmentation: Normal segmentation. Lowest well-formed disc labeled the L5-S1 level. Alignment: Trace retrolisthesis of L4 on L5. Trace anterolisthesis of L5 on  S1. Vertebral bodies otherwise normally aligned with preservation of the normal lumbar lordosis. Vertebrae: Vertebral body heights well maintained. No evidence for acute or chronic fracture. Bone marrow signal intensity within normal limits. Few scattered benign subcentimeter hemangiomas noted. No worrisome osseous lesions. No abnormal marrow edema or enhancement. Conus medullaris: Extends to the L1 level and appears normal. Paraspinal and other soft tissues: Paraspinous soft tissues within normal limits. Tiny T2 hyperintense simple cysts noted within the right kidney. Visualized visceral structures otherwise unremarkable. Disc levels: No significant degenerative changes seen through the L3-4 level. L4-5: Trace retrolisthesis. Disc desiccation with shallow broad posterior disc bulge. No stenosis or neural impingement. L5-S1: Trace anterolisthesis. No significant disc bulge. No canal or foraminal stenosis. No neural impingement. IMPRESSION: MRI BRAIN IMPRESSION: Normal MRI of the brain. No acute intracranial abnormality identified. MRI CERVICAL SPINE IMPRESSION: 1. Mild disc bulging at C3-4 and C4-5 without significant stenosis. 2. Otherwise normal MRI of the cervical spine. MRI THORACIC SPINE IMPRESSION: 1. Small disc protrusions at T6-7 and T8-9 without significant stenosis as above. 2. Otherwise normal MRI of the thoracic spine. MRI LUMBAR SPINE IMPRESSION: 1. Mild degenerative disc bulging at L4-5 without stenosis or neural impingement. 2. Otherwise unremarkable MRI of the lumbar spine. Electronically Signed   By: Rise Mu M.D.   On: 08/15/2017 22:59   Mr Cervical  Spine W Or Wo Contrast  Result Date: 08/15/2017 CLINICAL DATA:  Initial evaluation for acute tremors, vision changes EXAM: MRI TOTAL SPINE WITHOUT AND WITH CONTRAST TECHNIQUE: Multisequence MR imaging of the brain and surrounding structures was performed prior to and following contrast administration. Multisequence MR imaging of the  spine from the cervical spine to the sacrum was performed prior to and following IV contrast administration for evaluation of spinal metastatic disease. CONTRAST:  8mL MULTIHANCE GADOBENATE DIMEGLUMINE 529 MG/ML IV SOLN COMPARISON:  Prior head CT from earlier the same day. FINDINGS: MRI HEAD FINDINGS Brain: Mild age-related cerebral volume loss. No focal parenchymal signal abnormality. No significant cerebral white matter disease for age. No abnormal foci of restricted diffusion to suggest acute or subacute ischemia. No encephalomalacia to suggest chronic infarction. No solid artifact suggest acute or chronic intracranial hemorrhage. No mass lesion, midline shift or mass effect. No hydrocephalus. No extra-axial fluid collection. Major dural sinuses patent. No abnormal enhancement. Pituitary gland suprasellar region normal. Midline structures intact and normal. Vascular:  Major intracranial vascular flow voids well maintained. Skull base, calvarium, and soft tissues: Craniocervical junction normal. Bone marrow signal intensity within normal limits. Scalp soft tissues unremarkable. Orbits/sinues: Globes and orbital soft tissues within normal limits. Paranasal sinuses are clear. No mastoid effusion. Inner ear structures normal. MRI CERVICAL SPINE FINDINGS Alignment: Vertebral bodies normally aligned with preservation of the normal cervical lordosis. No listhesis. Vertebrae: Vertebral body heights well maintained. No evidence for acute or chronic fracture. Bone marrow signal intensity within normal limits. No discrete or worrisome osseous lesions. No abnormal marrow edema or enhancement. Cord: Signal intensity within the cervical spinal cord is normal. Posterior Fossa, vertebral arteries, paraspinal tissues: Paraspinous soft tissues within normal limits. Normal intravascular flow voids present within the vertebral arteries bilaterally. Disc levels: C2-C3: Unremarkable. C3-C4:  Mild degenerative disc bulge.  No stenosis.  C4-C5: Shallow central disc protrusion mildly indents the ventral thecal sac. No significant canal or foraminal stenosis. C5-C6:  Unremarkable. C6-C7:  Unremarkable. C7-T1:  Unremarkable. MRI THORACIC SPINE FINDINGS Alignment: Vertebral bodies normally aligned with preservation of the normal thoracic kyphosis. No listhesis. Vertebrae: Vertebral body heights well maintained. No evidence for acute or chronic fracture. Signal intensity within the vertebral body bone marrow is normal. Few scattered benign hemangiomas noted, most notable within the anterior aspect of the T4 vertebral body. No worrisome osseous lesions. No abnormal marrow edema or enhancement. Cord:  Signal intensity within the thoracic spinal cord is normal. Paraspinal and other soft tissues: Paraspinous soft tissues within normal limits. Partially visualized lungs are grossly clear. Visualized visceral structures unremarkable. Disc levels: T6-7: Small right paracentral disc protrusion flattens the right ventral thecal sac (series 24, image 20). Associated osseous ridging. No significant stenosis. T8-9: Left paracentral disc protrusion indents the left ventral thecal sac (series 24, image 25). No significant stenosis. No other significant degenerative changes within the thoracic spine. No canal or foraminal stenosis. MRI LUMBAR SPINE FINDINGS Segmentation: Normal segmentation. Lowest well-formed disc labeled the L5-S1 level. Alignment: Trace retrolisthesis of L4 on L5. Trace anterolisthesis of L5 on S1. Vertebral bodies otherwise normally aligned with preservation of the normal lumbar lordosis. Vertebrae: Vertebral body heights well maintained. No evidence for acute or chronic fracture. Bone marrow signal intensity within normal limits. Few scattered benign subcentimeter hemangiomas noted. No worrisome osseous lesions. No abnormal marrow edema or enhancement. Conus medullaris: Extends to the L1 level and appears normal. Paraspinal and other soft tissues:  Paraspinous soft tissues within normal limits. Tiny T2  hyperintense simple cysts noted within the right kidney. Visualized visceral structures otherwise unremarkable. Disc levels: No significant degenerative changes seen through the L3-4 level. L4-5: Trace retrolisthesis. Disc desiccation with shallow broad posterior disc bulge. No stenosis or neural impingement. L5-S1: Trace anterolisthesis. No significant disc bulge. No canal or foraminal stenosis. No neural impingement. IMPRESSION: MRI BRAIN IMPRESSION: Normal MRI of the brain. No acute intracranial abnormality identified. MRI CERVICAL SPINE IMPRESSION: 1. Mild disc bulging at C3-4 and C4-5 without significant stenosis. 2. Otherwise normal MRI of the cervical spine. MRI THORACIC SPINE IMPRESSION: 1. Small disc protrusions at T6-7 and T8-9 without significant stenosis as above. 2. Otherwise normal MRI of the thoracic spine. MRI LUMBAR SPINE IMPRESSION: 1. Mild degenerative disc bulging at L4-5 without stenosis or neural impingement. 2. Otherwise unremarkable MRI of the lumbar spine. Electronically Signed   By: Rise Mu M.D.   On: 08/15/2017 22:59   Mr Thoracic Spine W Wo Contrast  Result Date: 08/15/2017 CLINICAL DATA:  Initial evaluation for acute tremors, vision changes EXAM: MRI TOTAL SPINE WITHOUT AND WITH CONTRAST TECHNIQUE: Multisequence MR imaging of the brain and surrounding structures was performed prior to and following contrast administration. Multisequence MR imaging of the spine from the cervical spine to the sacrum was performed prior to and following IV contrast administration for evaluation of spinal metastatic disease. CONTRAST:  8mL MULTIHANCE GADOBENATE DIMEGLUMINE 529 MG/ML IV SOLN COMPARISON:  Prior head CT from earlier the same day. FINDINGS: MRI HEAD FINDINGS Brain: Mild age-related cerebral volume loss. No focal parenchymal signal abnormality. No significant cerebral white matter disease for age. No abnormal foci of  restricted diffusion to suggest acute or subacute ischemia. No encephalomalacia to suggest chronic infarction. No solid artifact suggest acute or chronic intracranial hemorrhage. No mass lesion, midline shift or mass effect. No hydrocephalus. No extra-axial fluid collection. Major dural sinuses patent. No abnormal enhancement. Pituitary gland suprasellar region normal. Midline structures intact and normal. Vascular:  Major intracranial vascular flow voids well maintained. Skull base, calvarium, and soft tissues: Craniocervical junction normal. Bone marrow signal intensity within normal limits. Scalp soft tissues unremarkable. Orbits/sinues: Globes and orbital soft tissues within normal limits. Paranasal sinuses are clear. No mastoid effusion. Inner ear structures normal. MRI CERVICAL SPINE FINDINGS Alignment: Vertebral bodies normally aligned with preservation of the normal cervical lordosis. No listhesis. Vertebrae: Vertebral body heights well maintained. No evidence for acute or chronic fracture. Bone marrow signal intensity within normal limits. No discrete or worrisome osseous lesions. No abnormal marrow edema or enhancement. Cord: Signal intensity within the cervical spinal cord is normal. Posterior Fossa, vertebral arteries, paraspinal tissues: Paraspinous soft tissues within normal limits. Normal intravascular flow voids present within the vertebral arteries bilaterally. Disc levels: C2-C3: Unremarkable. C3-C4:  Mild degenerative disc bulge.  No stenosis. C4-C5: Shallow central disc protrusion mildly indents the ventral thecal sac. No significant canal or foraminal stenosis. C5-C6:  Unremarkable. C6-C7:  Unremarkable. C7-T1:  Unremarkable. MRI THORACIC SPINE FINDINGS Alignment: Vertebral bodies normally aligned with preservation of the normal thoracic kyphosis. No listhesis. Vertebrae: Vertebral body heights well maintained. No evidence for acute or chronic fracture. Signal intensity within the vertebral body  bone marrow is normal. Few scattered benign hemangiomas noted, most notable within the anterior aspect of the T4 vertebral body. No worrisome osseous lesions. No abnormal marrow edema or enhancement. Cord:  Signal intensity within the thoracic spinal cord is normal. Paraspinal and other soft tissues: Paraspinous soft tissues within normal limits. Partially visualized lungs are grossly clear. Visualized  visceral structures unremarkable. Disc levels: T6-7: Small right paracentral disc protrusion flattens the right ventral thecal sac (series 24, image 20). Associated osseous ridging. No significant stenosis. T8-9: Left paracentral disc protrusion indents the left ventral thecal sac (series 24, image 25). No significant stenosis. No other significant degenerative changes within the thoracic spine. No canal or foraminal stenosis. MRI LUMBAR SPINE FINDINGS Segmentation: Normal segmentation. Lowest well-formed disc labeled the L5-S1 level. Alignment: Trace retrolisthesis of L4 on L5. Trace anterolisthesis of L5 on S1. Vertebral bodies otherwise normally aligned with preservation of the normal lumbar lordosis. Vertebrae: Vertebral body heights well maintained. No evidence for acute or chronic fracture. Bone marrow signal intensity within normal limits. Few scattered benign subcentimeter hemangiomas noted. No worrisome osseous lesions. No abnormal marrow edema or enhancement. Conus medullaris: Extends to the L1 level and appears normal. Paraspinal and other soft tissues: Paraspinous soft tissues within normal limits. Tiny T2 hyperintense simple cysts noted within the right kidney. Visualized visceral structures otherwise unremarkable. Disc levels: No significant degenerative changes seen through the L3-4 level. L4-5: Trace retrolisthesis. Disc desiccation with shallow broad posterior disc bulge. No stenosis or neural impingement. L5-S1: Trace anterolisthesis. No significant disc bulge. No canal or foraminal stenosis. No  neural impingement. IMPRESSION: MRI BRAIN IMPRESSION: Normal MRI of the brain. No acute intracranial abnormality identified. MRI CERVICAL SPINE IMPRESSION: 1. Mild disc bulging at C3-4 and C4-5 without significant stenosis. 2. Otherwise normal MRI of the cervical spine. MRI THORACIC SPINE IMPRESSION: 1. Small disc protrusions at T6-7 and T8-9 without significant stenosis as above. 2. Otherwise normal MRI of the thoracic spine. MRI LUMBAR SPINE IMPRESSION: 1. Mild degenerative disc bulging at L4-5 without stenosis or neural impingement. 2. Otherwise unremarkable MRI of the lumbar spine. Electronically Signed   By: Rise Mu M.D.   On: 08/15/2017 22:59   Mr Lumbar Spine W Wo Contrast  Result Date: 08/15/2017 CLINICAL DATA:  Initial evaluation for acute tremors, vision changes EXAM: MRI TOTAL SPINE WITHOUT AND WITH CONTRAST TECHNIQUE: Multisequence MR imaging of the brain and surrounding structures was performed prior to and following contrast administration. Multisequence MR imaging of the spine from the cervical spine to the sacrum was performed prior to and following IV contrast administration for evaluation of spinal metastatic disease. CONTRAST:  8mL MULTIHANCE GADOBENATE DIMEGLUMINE 529 MG/ML IV SOLN COMPARISON:  Prior head CT from earlier the same day. FINDINGS: MRI HEAD FINDINGS Brain: Mild age-related cerebral volume loss. No focal parenchymal signal abnormality. No significant cerebral white matter disease for age. No abnormal foci of restricted diffusion to suggest acute or subacute ischemia. No encephalomalacia to suggest chronic infarction. No solid artifact suggest acute or chronic intracranial hemorrhage. No mass lesion, midline shift or mass effect. No hydrocephalus. No extra-axial fluid collection. Major dural sinuses patent. No abnormal enhancement. Pituitary gland suprasellar region normal. Midline structures intact and normal. Vascular:  Major intracranial vascular flow voids well  maintained. Skull base, calvarium, and soft tissues: Craniocervical junction normal. Bone marrow signal intensity within normal limits. Scalp soft tissues unremarkable. Orbits/sinues: Globes and orbital soft tissues within normal limits. Paranasal sinuses are clear. No mastoid effusion. Inner ear structures normal. MRI CERVICAL SPINE FINDINGS Alignment: Vertebral bodies normally aligned with preservation of the normal cervical lordosis. No listhesis. Vertebrae: Vertebral body heights well maintained. No evidence for acute or chronic fracture. Bone marrow signal intensity within normal limits. No discrete or worrisome osseous lesions. No abnormal marrow edema or enhancement. Cord: Signal intensity within the cervical spinal cord is normal.  Posterior Fossa, vertebral arteries, paraspinal tissues: Paraspinous soft tissues within normal limits. Normal intravascular flow voids present within the vertebral arteries bilaterally. Disc levels: C2-C3: Unremarkable. C3-C4:  Mild degenerative disc bulge.  No stenosis. C4-C5: Shallow central disc protrusion mildly indents the ventral thecal sac. No significant canal or foraminal stenosis. C5-C6:  Unremarkable. C6-C7:  Unremarkable. C7-T1:  Unremarkable. MRI THORACIC SPINE FINDINGS Alignment: Vertebral bodies normally aligned with preservation of the normal thoracic kyphosis. No listhesis. Vertebrae: Vertebral body heights well maintained. No evidence for acute or chronic fracture. Signal intensity within the vertebral body bone marrow is normal. Few scattered benign hemangiomas noted, most notable within the anterior aspect of the T4 vertebral body. No worrisome osseous lesions. No abnormal marrow edema or enhancement. Cord:  Signal intensity within the thoracic spinal cord is normal. Paraspinal and other soft tissues: Paraspinous soft tissues within normal limits. Partially visualized lungs are grossly clear. Visualized visceral structures unremarkable. Disc levels: T6-7: Small  right paracentral disc protrusion flattens the right ventral thecal sac (series 24, image 20). Associated osseous ridging. No significant stenosis. T8-9: Left paracentral disc protrusion indents the left ventral thecal sac (series 24, image 25). No significant stenosis. No other significant degenerative changes within the thoracic spine. No canal or foraminal stenosis. MRI LUMBAR SPINE FINDINGS Segmentation: Normal segmentation. Lowest well-formed disc labeled the L5-S1 level. Alignment: Trace retrolisthesis of L4 on L5. Trace anterolisthesis of L5 on S1. Vertebral bodies otherwise normally aligned with preservation of the normal lumbar lordosis. Vertebrae: Vertebral body heights well maintained. No evidence for acute or chronic fracture. Bone marrow signal intensity within normal limits. Few scattered benign subcentimeter hemangiomas noted. No worrisome osseous lesions. No abnormal marrow edema or enhancement. Conus medullaris: Extends to the L1 level and appears normal. Paraspinal and other soft tissues: Paraspinous soft tissues within normal limits. Tiny T2 hyperintense simple cysts noted within the right kidney. Visualized visceral structures otherwise unremarkable. Disc levels: No significant degenerative changes seen through the L3-4 level. L4-5: Trace retrolisthesis. Disc desiccation with shallow broad posterior disc bulge. No stenosis or neural impingement. L5-S1: Trace anterolisthesis. No significant disc bulge. No canal or foraminal stenosis. No neural impingement. IMPRESSION: MRI BRAIN IMPRESSION: Normal MRI of the brain. No acute intracranial abnormality identified. MRI CERVICAL SPINE IMPRESSION: 1. Mild disc bulging at C3-4 and C4-5 without significant stenosis. 2. Otherwise normal MRI of the cervical spine. MRI THORACIC SPINE IMPRESSION: 1. Small disc protrusions at T6-7 and T8-9 without significant stenosis as above. 2. Otherwise normal MRI of the thoracic spine. MRI LUMBAR SPINE IMPRESSION: 1. Mild  degenerative disc bulging at L4-5 without stenosis or neural impingement. 2. Otherwise unremarkable MRI of the lumbar spine. Electronically Signed   By: Rise Mu M.D.   On: 08/15/2017 22:59        Scheduled Meds: . buPROPion  450 mg Oral Daily  . clonazePAM  1 mg Oral QHS  . enoxaparin (LOVENOX) injection  40 mg Subcutaneous Q24H  . feeding supplement (ENSURE ENLIVE)  237 mL Oral BID BM  . FLUoxetine  80 mg Oral Daily  . Norgestimate-Ethinyl Estradiol Triphasic  1 tablet Oral Daily  . topiramate  100 mg Oral Daily  . traZODone  50 mg Oral QHS  . ziprasidone  40 mg Oral QHS   Continuous Infusions:   LOS: 0 days    Time spent: 30 minutes    Barclay Lennox Hoover Brunette, MD [facility] Pager [phone]  If 7PM-7AM, please contact night-coverage [email or web address] Password [facility] 08/16/2017, 12:48 PM

## 2017-08-16 NOTE — Progress Notes (Addendum)
Initial Nutrition Assessment  DOCUMENTATION CODES:   Underweight, Non-severe (moderate) malnutrition in context of chronic illness  INTERVENTION:  Ensure Enlive po BID, each supplement provides 350 kcal and 20 grams of protein  Discussed the importance of protein intake for preservation of lean body mass. Included protein supplement information in discharge instructions.   NUTRITION DIAGNOSIS:   Malnutrition (Moderate) related to chronic illness (eating disorder) as evidenced by moderate depletions of muscle mass, moderate depletion of body fat.  GOAL:   Patient will meet greater than or equal to 90% of their needs  MONITOR:   PO intake, Supplement acceptance, Weight trends, Labs  REASON FOR ASSESSMENT:   Malnutrition Screening Tool    ASSESSMENT:   Pt with PMH significant for bipolar disorder, eating disorder, and thyroid disease. Presents this admission with c/o bilateral lower extremity weakness and staring into space for hours.   Spoke with pt at bedside who reports having a normal appetite prior to admission and during. Day to day meals typically consist of: B-cereal, sometimes an Ensure L- cottage cheese, yogurt, and fruit D- bites of meats with fruit, 1 Ensure  Pt reports trying to gain wt due to family concerns about her "looking sick", but nothing has worked. Pt has long reported history of anorexia nervosa, having multiple inpatient therapy treatments. Spoke with pt in regard to her low wt status and proper ways to gain wt post discharge. Pt states she has issues eating meats that she touches because "they gross her out." Discussed different options the pt could consume instead of meat. Pt seems willing to try different tips. When asked, she reports she is not afraid to increase the quantity of her meals if it helps her to gain wt.  Pt enjoys Ensure and drinks them daily at home. RD to provide supplementation this hospital stay.   Weight noted to be stable since last  admission 06/2017. Nutrition-Focused physical exam completed. Findings are moderate fat depletion, moderate muscle depletion, and no edema.   Medications reviewed.  Labs reviewed: Creatinine 1.06 (H)   Diet Order:  Diet regular Room service appropriate? Yes; Fluid consistency: Thin  Skin:  Reviewed, no issues  Last BM:  08/13/17  Height:   Ht Readings from Last 1 Encounters:  08/15/17 5' (1.524 m)    Weight:   Wt Readings from Last 1 Encounters:  08/15/17 88 lb 6.4 oz (40.1 kg)    Ideal Body Weight:  45.5 kg  BMI:  Body mass index is 17.26 kg/m.  Estimated Nutritional Needs:   Kcal:  1200-1400 (30-35 kcal/kg)  Protein:  70-80 grams (1.8-2 g/kg)  Fluid:  >1.2 L/day  EDUCATION NEEDS:   Education needs addressed  Vanessa Kick RD, LDN Clinical Nutrition Pager # [phone]

## 2017-08-17 DIAGNOSIS — F411 Generalized anxiety disorder: Secondary | ICD-10-CM

## 2017-08-17 DIAGNOSIS — R29898 Other symptoms and signs involving the musculoskeletal system: Secondary | ICD-10-CM | POA: Diagnosis not present

## 2017-08-17 DIAGNOSIS — F319 Bipolar disorder, unspecified: Secondary | ICD-10-CM

## 2017-08-17 DIAGNOSIS — Z818 Family history of other mental and behavioral disorders: Secondary | ICD-10-CM | POA: Diagnosis not present

## 2017-08-17 LAB — BASIC METABOLIC PANEL
Anion gap: 9 (ref 5–15)
BUN: 19 mg/dL (ref 6–20)
CO2: 22 mmol/L (ref 22–32)
CREATININE: 1.05 mg/dL — AB (ref 0.44–1.00)
Calcium: 8.6 mg/dL — ABNORMAL LOW (ref 8.9–10.3)
Chloride: 108 mmol/L (ref 101–111)
GFR calc Af Amer: >60 mL/min (ref >60)
Glucose, Bld: 91 mg/dL (ref 65–99)
POTASSIUM: 3.9 mmol/L (ref 3.5–5.1)
SODIUM: 139 mmol/L (ref 135–145)

## 2017-08-17 LAB — CBC
HCT: 35.4 % — ABNORMAL LOW (ref 36.0–46.0)
Hemoglobin: 11.5 g/dL — ABNORMAL LOW (ref 12.0–15.0)
MCH: 30 pg (ref 26.0–34.0)
MCHC: 32.5 g/dL (ref 30.0–36.0)
MCV: 92.4 fL (ref 78.0–100.0)
PLATELETS: 228 10*3/uL (ref 150–400)
RBC: 3.83 MIL/uL — AB (ref 3.87–5.11)
RDW: 14.1 % (ref 11.5–15.5)
WBC: 4.9 10*3/uL (ref 4.0–10.5)

## 2017-08-17 LAB — HIV ANTIBODY (ROUTINE TESTING W REFLEX): HIV Screen 4th Generation wRfx: NONREACTIVE

## 2017-08-17 NOTE — Consult Note (Signed)
[facility] Consult   Reason for Consult: multiple complaints, staring in space Referring Physician:  Dr. Manuella Ghazi Patient Identification: [name] MRN:  [id number] Principal Diagnosis: Lower extremity weakness Diagnosis:   Patient Active Problem List   Diagnosis Date Noted  . Lower extremity weakness [R29.898] 08/15/2017  . Bipolar I disorder, most recent episode depressed ([place]) [F31.30] 08/16/2015  . Insomnia [G47.00] 08/16/2015  . Bipolar I disorder, current or most recent episode hypomanic ([place]) [F31.0] 03/03/2015  . GAD (generalized anxiety disorder) [F41.1] 09/30/2014  . Anorexia nervosa [F50.00] 11/18/2013  . Bipolar I disorder, most recent episode (or current) depressed, unspecified [F31.30] 10/28/2013    Total Time spent with patient: 45 minutes  Subjective:   [name] is a 46 y.o. female patient admitted with bilateral lower extremities weakness.  HPI:  Patient reports history of Bipolar disorder and Generalized anxiety disorder. She reports that she was asked to be hospitalized on Thursday after she called her psychiatrist and compliant that  she felt like she was having a stroke. She reports having visual changes, bilateral lower extremity weakness, bedwetting, night sweats, tremors, and staring off into space. She state that she has undergone multiple test since her admissions, all with no significant finding. Patient reports that she is feeling stable today and requesting to be discharged home to her husband. She denies mood swings, depression, psychosis, delusions, suicidal or homicidal ideations, intent or plan.   Past Psychiatric History: as above  Risk to Self: Is patient at risk for suicide?: No Risk to Others:   Prior Inpatient Therapy:   Prior Outpatient Therapy:    Past Medical History:  Past Medical History:  Diagnosis Date  . Bipolar 1 disorder ([place])   . Eating disorder   . Thyroid disease     Past Surgical History:  Procedure  Laterality Date  . LEEP    . MULTIPLE TOOTH EXTRACTIONS    . TYMPANOSTOMY TUBE PLACEMENT     Family History:  Family History  Problem Relation Age of Onset  . Schizophrenia Father   . Bipolar disorder Father   . Schizophrenia Paternal Grandmother   . Breast cancer Mother 44  . Pancreatic cancer Maternal Aunt   . Schizophrenia Paternal Grandfather   . Heart attack Maternal Grandfather   . Suicidality Neg Hx    Family Psychiatric  History:  Social History:  History  Alcohol Use No     History  Drug Use No    Social History   Social History  . Marital status: Divorced    Spouse name: N/A  . Number of children: N/A  . Years of education: N/A   Social History Main Topics  . Smoking status: Never Smoker  . Smokeless tobacco: Never Used  . Alcohol use No  . Drug use: No  . Sexual activity: Yes    Partners: Male    Birth control/ protection: Pill   Other Topics Concern  . None   Social History Narrative  . None   Additional Social History:    Allergies:   Allergies  Allergen Reactions  . Anette Guarneri [Lurasidone Hcl] Nausea And Vomiting    Complete GI upset/ nausea, vomiting and diarrhea.   . Sulfa Antibiotics Nausea And Vomiting    Labs:  Results for orders placed or performed during the hospital encounter of 08/15/17 (from the past 48 hour(s))  Comprehensive metabolic panel     Status: Abnormal   Collection Time: 08/15/17  5:26 PM  Result Value Ref  Range   Sodium 137 135 - 145 mmol/L   Potassium 3.6 3.5 - 5.1 mmol/L   Chloride 109 101 - 111 mmol/L   CO2 20 (L) 22 - 32 mmol/L   Glucose, Bld 82 65 - 99 mg/dL   BUN 20 6 - 20 mg/dL   Creatinine, Ser 1.06 (H) 0.44 - 1.00 mg/dL   Calcium 8.8 (L) 8.9 - 10.3 mg/dL   Total Protein 6.5 6.5 - 8.1 g/dL   Albumin 3.6 3.5 - 5.0 g/dL   AST 16 15 - 41 U/L   ALT 17 14 - 54 U/L   Alkaline Phosphatase 29 (L) 38 - 126 U/L   Total Bilirubin 0.5 0.3 - 1.2 mg/dL   GFR calc non Af Amer >60 >60 mL/min   GFR calc Af Amer >60  >60 mL/min    Comment: (NOTE) The eGFR has been calculated using the CKD EPI equation. This calculation has not been validated in all clinical situations. eGFR's persistently <60 mL/min signify possible Chronic Kidney Disease.    Anion gap 8 5 - 15  Ethanol     Status: None   Collection Time: 08/15/17  5:26 PM  Result Value Ref Range   Alcohol, Ethyl (B) <5 <5 mg/dL    Comment:        LOWEST DETECTABLE LIMIT FOR SERUM ALCOHOL IS 5 mg/dL FOR MEDICAL PURPOSES ONLY   Salicylate level     Status: None   Collection Time: 08/15/17  5:26 PM  Result Value Ref Range   Salicylate Lvl <4.2 2.8 - 30.0 mg/dL  Acetaminophen level     Status: Abnormal   Collection Time: 08/15/17  5:26 PM  Result Value Ref Range   Acetaminophen (Tylenol), Serum <10 (L) 10 - 30 ug/mL    Comment:        THERAPEUTIC CONCENTRATIONS VARY SIGNIFICANTLY. A RANGE OF 10-30 ug/mL MAY BE AN EFFECTIVE CONCENTRATION FOR MANY PATIENTS. HOWEVER, SOME ARE BEST TREATED AT CONCENTRATIONS OUTSIDE THIS RANGE. ACETAMINOPHEN CONCENTRATIONS >150 ug/mL AT 4 HOURS AFTER INGESTION AND >50 ug/mL AT 12 HOURS AFTER INGESTION ARE OFTEN ASSOCIATED WITH TOXIC REACTIONS.   CBC with Differential     Status: Abnormal   Collection Time: 08/15/17  5:26 PM  Result Value Ref Range   WBC 5.5 4.0 - 10.5 K/uL   RBC 3.90 3.87 - 5.11 MIL/uL   Hemoglobin 11.5 (L) 12.0 - 15.0 g/dL   HCT 34.9 (L) 36.0 - 46.0 %   MCV 89.5 78.0 - 100.0 fL   MCH 29.5 26.0 - 34.0 pg   MCHC 33.0 30.0 - 36.0 g/dL   RDW 13.8 11.5 - 15.5 %   Platelets 265 150 - 400 K/uL   Neutrophils Relative % 62 %   Neutro Abs 3.4 1.7 - 7.7 K/uL   Lymphocytes Relative 31 %   Lymphs Abs 1.7 0.7 - 4.0 K/uL   Monocytes Relative 6 %   Monocytes Absolute 0.3 0.1 - 1.0 K/uL   Eosinophils Relative 1 %   Eosinophils Absolute 0.1 0.0 - 0.7 K/uL   Basophils Relative 0 %   Basophils Absolute 0.0 0.0 - 0.1 K/uL  TSH     Status: None   Collection Time: 08/15/17  5:26 PM  Result Value  Ref Range   TSH 2.955 0.350 - 4.500 uIU/mL    Comment: Performed by a 3rd Generation assay with a functional sensitivity of <=0.01 uIU/mL.  I-Stat beta hCG blood, ED     Status: None   Collection  Time: 08/15/17  5:35 PM  Result Value Ref Range   I-stat hCG, quantitative <5.0 <5 mIU/mL   Comment 3            Comment:   GEST. AGE      CONC.  (mIU/mL)   <=1 WEEK        5 - 50     2 WEEKS       50 - 500     3 WEEKS       100 - 10,000     4 WEEKS     1,000 - 30,000        FEMALE AND NON-PREGNANT FEMALE:     LESS THAN 5 mIU/mL   HIV antibody (Routine Testing)     Status: None   Collection Time: 08/15/17 11:19 PM  Result Value Ref Range   HIV Screen 4th Generation wRfx Non Reactive Non Reactive    Comment: (NOTE) Performed At: [facility] [place], [place] [id number] Lindon Romp MD [id number]   Rapid urine drug screen (hospital performed)     Status: None   Collection Time: 08/16/17  5:29 AM  Result Value Ref Range   Opiates NONE DETECTED NONE DETECTED   Cocaine NONE DETECTED NONE DETECTED   Benzodiazepines NONE DETECTED NONE DETECTED   Amphetamines NONE DETECTED NONE DETECTED   Tetrahydrocannabinol NONE DETECTED NONE DETECTED   Barbiturates NONE DETECTED NONE DETECTED    Comment:        DRUG SCREEN FOR MEDICAL PURPOSES ONLY.  IF CONFIRMATION IS NEEDED FOR ANY PURPOSE, NOTIFY LAB WITHIN 5 DAYS.        LOWEST DETECTABLE LIMITS FOR URINE DRUG SCREEN Drug Class       Cutoff (ng/mL) Amphetamine      1000 Barbiturate      200 Benzodiazepine   867 Tricyclics       619 Opiates          300 Cocaine          300 THC              50   Urinalysis, Routine w reflex microscopic     Status: None   Collection Time: 08/16/17  5:29 AM  Result Value Ref Range   Color, Urine YELLOW YELLOW   APPearance CLEAR CLEAR   Specific Gravity, Urine 1.010 1.005 - 1.030   pH 6.0 5.0 - 8.0   Glucose, UA NEGATIVE NEGATIVE mg/dL   Hgb urine dipstick NEGATIVE NEGATIVE    Bilirubin Urine NEGATIVE NEGATIVE   Ketones, ur NEGATIVE NEGATIVE mg/dL   Protein, ur NEGATIVE NEGATIVE mg/dL   Nitrite NEGATIVE NEGATIVE   Leukocytes, UA NEGATIVE NEGATIVE  CBC     Status: Abnormal   Collection Time: 08/17/17  6:02 AM  Result Value Ref Range   WBC 4.9 4.0 - 10.5 K/uL   RBC 3.83 (L) 3.87 - 5.11 MIL/uL   Hemoglobin 11.5 (L) 12.0 - 15.0 g/dL   HCT 35.4 (L) 36.0 - 46.0 %   MCV 92.4 78.0 - 100.0 fL   MCH 30.0 26.0 - 34.0 pg   MCHC 32.5 30.0 - 36.0 g/dL   RDW 14.1 11.5 - 15.5 %   Platelets 228 150 - 400 K/uL  Basic metabolic panel     Status: Abnormal   Collection Time: 08/17/17  6:02 AM  Result Value Ref Range   Sodium 139 135 - 145 mmol/L   Potassium 3.9 3.5 - 5.1 mmol/L  Chloride 108 101 - 111 mmol/L   CO2 22 22 - 32 mmol/L   Glucose, Bld 91 65 - 99 mg/dL   BUN 19 6 - 20 mg/dL   Creatinine, Ser 1.05 (H) 0.44 - 1.00 mg/dL   Calcium 8.6 (L) 8.9 - 10.3 mg/dL   GFR calc non Af Amer >60 >60 mL/min   GFR calc Af Amer >60 >60 mL/min    Comment: (NOTE) The eGFR has been calculated using the CKD EPI equation. This calculation has not been validated in all clinical situations. eGFR's persistently <60 mL/min signify possible Chronic Kidney Disease.    Anion gap 9 5 - 15    Current Facility-Administered Medications  Medication Dose Route Frequency Provider Last Rate Last Dose  . acetaminophen (TYLENOL) tablet 650 mg  650 mg Oral Q6H PRN Manuella Ghazi, Pratik D, DO      . buPROPion (WELLBUTRIN XL) 24 hr tablet 450 mg  450 mg Oral Daily Emokpae, Ejiroghene E, MD   450 mg at 08/17/17 1117  . clonazePAM (KLONOPIN) tablet 1 mg  1 mg Oral QHS Emokpae, Ejiroghene E, MD   1 mg at 08/16/17 2138  . enoxaparin (LOVENOX) injection 40 mg  40 mg Subcutaneous Q24H Emokpae, Ejiroghene E, MD   40 mg at 08/16/17 1010  . feeding supplement (ENSURE ENLIVE) (ENSURE ENLIVE) liquid 237 mL  237 mL Oral BID BM Emokpae, Ejiroghene E, MD   237 mL at 08/17/17 1000  . FLUoxetine (PROZAC) capsule 80 mg   80 mg Oral Daily Emokpae, Ejiroghene E, MD   80 mg at 08/17/17 1116  . loperamide (IMODIUM) capsule 2 mg  2 mg Oral PRN Manuella Ghazi, Pratik D, DO      . Norgestimate-Ethinyl Estradiol Triphasic 0.18/0.215/0.25 MG-25 MCG tablet 1 tablet  1 tablet Oral Daily Emokpae, Ejiroghene E, MD      . topiramate (TOPAMAX) tablet 100 mg  100 mg Oral Daily Emokpae, Ejiroghene E, MD   100 mg at 08/17/17 1116  . traZODone (DESYREL) tablet 50 mg  50 mg Oral QHS Emokpae, Ejiroghene E, MD   50 mg at 08/16/17 2138  . ziprasidone (GEODON) capsule 40 mg  40 mg Oral QHS Emokpae, Ejiroghene E, MD   40 mg at 08/16/17 2138    Musculoskeletal: Strength & Muscle Tone: within normal limits Gait & Station: normal Patient leans: N/A  Psychiatric Specialty Exam: Physical Exam  Psychiatric: She has a normal mood and affect. Her speech is normal and behavior is normal. Judgment and thought content normal. Cognition and memory are normal.    Review of Systems  Constitutional: Negative.   HENT: Negative.   Eyes: Negative.   Respiratory: Negative.   Cardiovascular: Negative.   Gastrointestinal: Negative.   Genitourinary: Negative.   Musculoskeletal: Negative.   Skin: Negative.   Neurological: Negative.   Endo/Heme/Allergies: Negative.   Psychiatric/Behavioral: Negative.     Blood pressure (!) 88/48, pulse 65, temperature (!) 97.5 F (36.4 C), temperature source Oral, resp. rate 18, height 5' (1.524 m), weight 40.1 kg (88 lb 6.4 oz), last menstrual period 07/31/2017, SpO2 100 %.Body mass index is 17.26 kg/m.  General Appearance: Casual  Eye Contact:  Good  Speech:  Clear and Coherent  Volume:  Normal  Mood:  Euthymic  Affect:  Appropriate  Thought Process:  Coherent and Descriptions of Associations: Intact  Orientation:  Full (Time, Place, and Person)  Thought Content:  Logical  Suicidal Thoughts:  No  Homicidal Thoughts:  No  Memory:  Immediate;  Fair Recent;   Fair Remote;   Fair  Judgement:  Intact  Insight:   Good  Psychomotor Activity:  Normal  Concentration:  Concentration: Fair and Attention Span: Fair  Recall:  Good  Fund of Knowledge:  Good  Language:  Good  Akathisia:  No  Handed:  Right  AIMS (if indicated):     Assets:  Communication Skills Desire for Improvement Social Support  ADL's:  Intact  Cognition:  WNL  Sleep:   fair     Treatment Plan Summary: Patient with history of Bipolar disorder, Anxiety and was admitted for multiple symptoms but she is currently stable psychiatrically.  Disposition: No evidence of imminent risk to self or others at present.   Patient does not meet criteria for psychiatric inpatient admission. Supportive therapy provided about ongoing stressors. Social worker to assit with referring patient back to her outpatient psychiatrist upon discharge  Corena Pilgrim, MD 08/17/2017 1:29 PM

## 2017-08-17 NOTE — Discharge Summary (Signed)
Physician Discharge Summary  [name] FAO:[id number] DOB: Sep 30, 1971 DOA: 08/15/2017  PCP: Deatra James, MD  Admit date: 08/15/2017 Discharge date: 08/17/2017  Admitted From: Home Disposition:  Home  Recommendations for Outpatient Follow-up:  1. Follow up with PCP in 3-4 weeks 2. Follow up with Psychiatrist Dr. Theadora Rama in 1-2 weeks  Home Health:NO Equipment/Devices:None  Discharge Condition:Stable CODE STATUS:Full Diet recommendation: Heart Healthy  Brief/Interim Summary: This is a 46 year old Caucasian female with history significant for generalized anxiety disorder, bipolar disorder who was sent to the emergency department from her psychiatrist's office with noted visual changes, bilateral lower extremity weakness, bedwetting, night sweats, tremors, and staring off into space. She has thus far undergone MRI of the brain and spine with no significant findings. EEG demonstrates beta wave activity consistent with excessive benzodiazepine use, but no epileptiform activity is noted. PT evaluation Revealed no significant findings. She has been seen by psychiatry today and is noted to be stable for discharge back to home. She is to continue her routine home medications as prescribed and follow up with her psychiatrist in the outpatient setting within the next 1 week. No other acute events have been noted during the course of this admission.  Discharge Diagnoses:  Principal Problem:   Lower extremity weakness Active Problems:   GAD (generalized anxiety disorder)   Bipolar I disorder, current or most recent episode hypomanic (HCC)   Insomnia   Bilateral lower extremity weakness- With multiple complaints appears all unrelated-no specific neurological issues noted today. - Fully resolved with nothing to specifically attribute this problem to  Seizure history- no seizures since age 45. - Continue home Topamax - EEG with no epileptiform activity noted, but may need prolonged evaluation in  the futures if symptoms persist -She has been stable throughout this admission  BPD-follows with Dr. Theadora Rama - continue home ziprasidone trazodone Prozac - Continue home Klonopin 1 mg daily at bedtime for insomnia and anxiety  Discharge Instructions  Discharge Instructions    Diet - low sodium heart healthy    Complete by:  As directed    Increase activity slowly    Complete by:  As directed      Allergies as of 08/17/2017      Reactions   Latuda [lurasidone Hcl] Nausea And Vomiting   Complete GI upset/ nausea, vomiting and diarrhea.    Sulfa Antibiotics Nausea And Vomiting      Medication List    TAKE these medications   buPROPion 150 MG 24 hr tablet Commonly known as:  WELLBUTRIN XL Take 3 tablets (450 mg total) by mouth daily.   clonazePAM 1 MG tablet Commonly known as:  KLONOPIN Take 1 tablet (1 mg total) by mouth 2 (two) times daily.   FLUoxetine 40 MG capsule Commonly known as:  PROZAC Take 2 capsules (80 mg total) by mouth daily.   Melatonin 10 MG Tbdp Take 10 mg by mouth at bedtime as needed (sleep).   multivitamin with minerals Tabs tablet Take 1 tablet by mouth daily.   ORTHO TRI-CYCLEN LO 0.18/0.215/0.25 MG-25 MCG tab Generic drug:  Norgestimate-Ethinyl Estradiol Triphasic Take 1 tablet by mouth daily.   topiramate 100 MG tablet Commonly known as:  TOPAMAX TAKE 1 BY MOUTH DAILY   traZODone 50 MG tablet Commonly known as:  DESYREL Take 1 tablet (50 mg total) by mouth at bedtime.   ziprasidone 40 MG capsule Commonly known as:  GEODON Take 1 capsule (40 mg total) by mouth at bedtime.  Discharge Care Instructions        Start     Ordered   08/17/17 0000  Increase activity slowly     08/17/17 1340   08/17/17 0000  Diet - low sodium heart healthy     08/17/17 1340     Follow-up Information    Deatra James, MD Follow up in 4 week(s).   Specialty:  Family Medicine Contact information: [phone] [address] [place] [address] [phone]        psychiatry Follow up in 1 week(s).   Contact information: Dr. Theadora Rama         Allergies  Allergen Reactions  . Kasandra Knudsen [Lurasidone Hcl] Nausea And Vomiting    Complete GI upset/ nausea, vomiting and diarrhea.   . Sulfa Antibiotics Nausea And Vomiting    Consultations:  Psychiatry Dr. Jannifer Franklin   Procedures/Studies: Dg Chest 2 View  Result Date: 08/15/2017 CLINICAL DATA:  Night sweats. EXAM: CHEST  2 VIEW COMPARISON:  None. FINDINGS: The heart size and mediastinal contours are within normal limits. Both lungs are clear. The visualized skeletal structures are unremarkable. IMPRESSION: No active cardiopulmonary disease. Electronically Signed   By: Lupita Raider, M.D.   On: 08/15/2017 18:49   Ct Head Wo Contrast  Result Date: 08/15/2017 CLINICAL DATA:  Visual disturbance, incontinence, tremor, anxiety EXAM: CT HEAD WITHOUT CONTRAST TECHNIQUE: Contiguous axial images were obtained from the base of the skull through the vertex without intravenous contrast. COMPARISON:  None available FINDINGS: Brain: No evidence of acute infarction, hemorrhage, hydrocephalus, extra-axial collection or mass lesion/mass effect. Vascular: No hyperdense vessel or unexpected calcification. Skull: Normal. Negative for fracture or focal lesion. Sinuses/Orbits: No acute finding. Other: None. IMPRESSION: Normal head CT without contrast Electronically Signed   By: Judie Petit.  Shick M.D.   On: 08/15/2017 18:21   Mr Brain W And Wo Contrast  Result Date: 08/15/2017 CLINICAL DATA:  Initial evaluation for acute tremors, vision changes EXAM: MRI TOTAL SPINE WITHOUT AND WITH CONTRAST TECHNIQUE: Multisequence MR imaging of the brain and surrounding structures was performed prior to and following contrast administration. Multisequence MR imaging of the spine from the cervical spine to the sacrum was performed prior to and following IV contrast administration for evaluation of spinal  metastatic disease. CONTRAST:  8mL MULTIHANCE GADOBENATE DIMEGLUMINE 529 MG/ML IV SOLN COMPARISON:  Prior head CT from earlier the same day. FINDINGS: MRI HEAD FINDINGS Brain: Mild age-related cerebral volume loss. No focal parenchymal signal abnormality. No significant cerebral white matter disease for age. No abnormal foci of restricted diffusion to suggest acute or subacute ischemia. No encephalomalacia to suggest chronic infarction. No solid artifact suggest acute or chronic intracranial hemorrhage. No mass lesion, midline shift or mass effect. No hydrocephalus. No extra-axial fluid collection. Major dural sinuses patent. No abnormal enhancement. Pituitary gland suprasellar region normal. Midline structures intact and normal. Vascular:  Major intracranial vascular flow voids well maintained. Skull base, calvarium, and soft tissues: Craniocervical junction normal. Bone marrow signal intensity within normal limits. Scalp soft tissues unremarkable. Orbits/sinues: Globes and orbital soft tissues within normal limits. Paranasal sinuses are clear. No mastoid effusion. Inner ear structures normal. MRI CERVICAL SPINE FINDINGS Alignment: Vertebral bodies normally aligned with preservation of the normal cervical lordosis. No listhesis. Vertebrae: Vertebral body heights well maintained. No evidence for acute or chronic fracture. Bone marrow signal intensity within normal limits. No discrete or worrisome osseous lesions. No abnormal marrow edema or enhancement. Cord: Signal intensity within the cervical spinal cord is  normal. Posterior Fossa, vertebral arteries, paraspinal tissues: Paraspinous soft tissues within normal limits. Normal intravascular flow voids present within the vertebral arteries bilaterally. Disc levels: C2-C3: Unremarkable. C3-C4:  Mild degenerative disc bulge.  No stenosis. C4-C5: Shallow central disc protrusion mildly indents the ventral thecal sac. No significant canal or foraminal stenosis. C5-C6:   Unremarkable. C6-C7:  Unremarkable. C7-T1:  Unremarkable. MRI THORACIC SPINE FINDINGS Alignment: Vertebral bodies normally aligned with preservation of the normal thoracic kyphosis. No listhesis. Vertebrae: Vertebral body heights well maintained. No evidence for acute or chronic fracture. Signal intensity within the vertebral body bone marrow is normal. Few scattered benign hemangiomas noted, most notable within the anterior aspect of the T4 vertebral body. No worrisome osseous lesions. No abnormal marrow edema or enhancement. Cord:  Signal intensity within the thoracic spinal cord is normal. Paraspinal and other soft tissues: Paraspinous soft tissues within normal limits. Partially visualized lungs are grossly clear. Visualized visceral structures unremarkable. Disc levels: T6-7: Small right paracentral disc protrusion flattens the right ventral thecal sac (series 24, image 20). Associated osseous ridging. No significant stenosis. T8-9: Left paracentral disc protrusion indents the left ventral thecal sac (series 24, image 25). No significant stenosis. No other significant degenerative changes within the thoracic spine. No canal or foraminal stenosis. MRI LUMBAR SPINE FINDINGS Segmentation: Normal segmentation. Lowest well-formed disc labeled the L5-S1 level. Alignment: Trace retrolisthesis of L4 on L5. Trace anterolisthesis of L5 on S1. Vertebral bodies otherwise normally aligned with preservation of the normal lumbar lordosis. Vertebrae: Vertebral body heights well maintained. No evidence for acute or chronic fracture. Bone marrow signal intensity within normal limits. Few scattered benign subcentimeter hemangiomas noted. No worrisome osseous lesions. No abnormal marrow edema or enhancement. Conus medullaris: Extends to the L1 level and appears normal. Paraspinal and other soft tissues: Paraspinous soft tissues within normal limits. Tiny T2 hyperintense simple cysts noted within the right kidney. Visualized  visceral structures otherwise unremarkable. Disc levels: No significant degenerative changes seen through the L3-4 level. L4-5: Trace retrolisthesis. Disc desiccation with shallow broad posterior disc bulge. No stenosis or neural impingement. L5-S1: Trace anterolisthesis. No significant disc bulge. No canal or foraminal stenosis. No neural impingement. IMPRESSION: MRI BRAIN IMPRESSION: Normal MRI of the brain. No acute intracranial abnormality identified. MRI CERVICAL SPINE IMPRESSION: 1. Mild disc bulging at C3-4 and C4-5 without significant stenosis. 2. Otherwise normal MRI of the cervical spine. MRI THORACIC SPINE IMPRESSION: 1. Small disc protrusions at T6-7 and T8-9 without significant stenosis as above. 2. Otherwise normal MRI of the thoracic spine. MRI LUMBAR SPINE IMPRESSION: 1. Mild degenerative disc bulging at L4-5 without stenosis or neural impingement. 2. Otherwise unremarkable MRI of the lumbar spine. Electronically Signed   By: Rise Mu M.D.   On: 08/15/2017 22:59   Mr Cervical Spine W Or Wo Contrast  Result Date: 08/15/2017 CLINICAL DATA:  Initial evaluation for acute tremors, vision changes EXAM: MRI TOTAL SPINE WITHOUT AND WITH CONTRAST TECHNIQUE: Multisequence MR imaging of the brain and surrounding structures was performed prior to and following contrast administration. Multisequence MR imaging of the spine from the cervical spine to the sacrum was performed prior to and following IV contrast administration for evaluation of spinal metastatic disease. CONTRAST:  8mL MULTIHANCE GADOBENATE DIMEGLUMINE 529 MG/ML IV SOLN COMPARISON:  Prior head CT from earlier the same day. FINDINGS: MRI HEAD FINDINGS Brain: Mild age-related cerebral volume loss. No focal parenchymal signal abnormality. No significant cerebral white matter disease for age. No abnormal foci of restricted diffusion to suggest  acute or subacute ischemia. No encephalomalacia to suggest chronic infarction. No solid artifact  suggest acute or chronic intracranial hemorrhage. No mass lesion, midline shift or mass effect. No hydrocephalus. No extra-axial fluid collection. Major dural sinuses patent. No abnormal enhancement. Pituitary gland suprasellar region normal. Midline structures intact and normal. Vascular:  Major intracranial vascular flow voids well maintained. Skull base, calvarium, and soft tissues: Craniocervical junction normal. Bone marrow signal intensity within normal limits. Scalp soft tissues unremarkable. Orbits/sinues: Globes and orbital soft tissues within normal limits. Paranasal sinuses are clear. No mastoid effusion. Inner ear structures normal. MRI CERVICAL SPINE FINDINGS Alignment: Vertebral bodies normally aligned with preservation of the normal cervical lordosis. No listhesis. Vertebrae: Vertebral body heights well maintained. No evidence for acute or chronic fracture. Bone marrow signal intensity within normal limits. No discrete or worrisome osseous lesions. No abnormal marrow edema or enhancement. Cord: Signal intensity within the cervical spinal cord is normal. Posterior Fossa, vertebral arteries, paraspinal tissues: Paraspinous soft tissues within normal limits. Normal intravascular flow voids present within the vertebral arteries bilaterally. Disc levels: C2-C3: Unremarkable. C3-C4:  Mild degenerative disc bulge.  No stenosis. C4-C5: Shallow central disc protrusion mildly indents the ventral thecal sac. No significant canal or foraminal stenosis. C5-C6:  Unremarkable. C6-C7:  Unremarkable. C7-T1:  Unremarkable. MRI THORACIC SPINE FINDINGS Alignment: Vertebral bodies normally aligned with preservation of the normal thoracic kyphosis. No listhesis. Vertebrae: Vertebral body heights well maintained. No evidence for acute or chronic fracture. Signal intensity within the vertebral body bone marrow is normal. Few scattered benign hemangiomas noted, most notable within the anterior aspect of the T4 vertebral body.  No worrisome osseous lesions. No abnormal marrow edema or enhancement. Cord:  Signal intensity within the thoracic spinal cord is normal. Paraspinal and other soft tissues: Paraspinous soft tissues within normal limits. Partially visualized lungs are grossly clear. Visualized visceral structures unremarkable. Disc levels: T6-7: Small right paracentral disc protrusion flattens the right ventral thecal sac (series 24, image 20). Associated osseous ridging. No significant stenosis. T8-9: Left paracentral disc protrusion indents the left ventral thecal sac (series 24, image 25). No significant stenosis. No other significant degenerative changes within the thoracic spine. No canal or foraminal stenosis. MRI LUMBAR SPINE FINDINGS Segmentation: Normal segmentation. Lowest well-formed disc labeled the L5-S1 level. Alignment: Trace retrolisthesis of L4 on L5. Trace anterolisthesis of L5 on S1. Vertebral bodies otherwise normally aligned with preservation of the normal lumbar lordosis. Vertebrae: Vertebral body heights well maintained. No evidence for acute or chronic fracture. Bone marrow signal intensity within normal limits. Few scattered benign subcentimeter hemangiomas noted. No worrisome osseous lesions. No abnormal marrow edema or enhancement. Conus medullaris: Extends to the L1 level and appears normal. Paraspinal and other soft tissues: Paraspinous soft tissues within normal limits. Tiny T2 hyperintense simple cysts noted within the right kidney. Visualized visceral structures otherwise unremarkable. Disc levels: No significant degenerative changes seen through the L3-4 level. L4-5: Trace retrolisthesis. Disc desiccation with shallow broad posterior disc bulge. No stenosis or neural impingement. L5-S1: Trace anterolisthesis. No significant disc bulge. No canal or foraminal stenosis. No neural impingement. IMPRESSION: MRI BRAIN IMPRESSION: Normal MRI of the brain. No acute intracranial abnormality identified. MRI  CERVICAL SPINE IMPRESSION: 1. Mild disc bulging at C3-4 and C4-5 without significant stenosis. 2. Otherwise normal MRI of the cervical spine. MRI THORACIC SPINE IMPRESSION: 1. Small disc protrusions at T6-7 and T8-9 without significant stenosis as above. 2. Otherwise normal MRI of the thoracic spine. MRI LUMBAR SPINE IMPRESSION: 1. Mild degenerative  disc bulging at L4-5 without stenosis or neural impingement. 2. Otherwise unremarkable MRI of the lumbar spine. Electronically Signed   By: Rise Mu M.D.   On: 08/15/2017 22:59   Mr Thoracic Spine W Wo Contrast  Result Date: 08/15/2017 CLINICAL DATA:  Initial evaluation for acute tremors, vision changes EXAM: MRI TOTAL SPINE WITHOUT AND WITH CONTRAST TECHNIQUE: Multisequence MR imaging of the brain and surrounding structures was performed prior to and following contrast administration. Multisequence MR imaging of the spine from the cervical spine to the sacrum was performed prior to and following IV contrast administration for evaluation of spinal metastatic disease. CONTRAST:  8mL MULTIHANCE GADOBENATE DIMEGLUMINE 529 MG/ML IV SOLN COMPARISON:  Prior head CT from earlier the same day. FINDINGS: MRI HEAD FINDINGS Brain: Mild age-related cerebral volume loss. No focal parenchymal signal abnormality. No significant cerebral white matter disease for age. No abnormal foci of restricted diffusion to suggest acute or subacute ischemia. No encephalomalacia to suggest chronic infarction. No solid artifact suggest acute or chronic intracranial hemorrhage. No mass lesion, midline shift or mass effect. No hydrocephalus. No extra-axial fluid collection. Major dural sinuses patent. No abnormal enhancement. Pituitary gland suprasellar region normal. Midline structures intact and normal. Vascular:  Major intracranial vascular flow voids well maintained. Skull base, calvarium, and soft tissues: Craniocervical junction normal. Bone marrow signal intensity within normal  limits. Scalp soft tissues unremarkable. Orbits/sinues: Globes and orbital soft tissues within normal limits. Paranasal sinuses are clear. No mastoid effusion. Inner ear structures normal. MRI CERVICAL SPINE FINDINGS Alignment: Vertebral bodies normally aligned with preservation of the normal cervical lordosis. No listhesis. Vertebrae: Vertebral body heights well maintained. No evidence for acute or chronic fracture. Bone marrow signal intensity within normal limits. No discrete or worrisome osseous lesions. No abnormal marrow edema or enhancement. Cord: Signal intensity within the cervical spinal cord is normal. Posterior Fossa, vertebral arteries, paraspinal tissues: Paraspinous soft tissues within normal limits. Normal intravascular flow voids present within the vertebral arteries bilaterally. Disc levels: C2-C3: Unremarkable. C3-C4:  Mild degenerative disc bulge.  No stenosis. C4-C5: Shallow central disc protrusion mildly indents the ventral thecal sac. No significant canal or foraminal stenosis. C5-C6:  Unremarkable. C6-C7:  Unremarkable. C7-T1:  Unremarkable. MRI THORACIC SPINE FINDINGS Alignment: Vertebral bodies normally aligned with preservation of the normal thoracic kyphosis. No listhesis. Vertebrae: Vertebral body heights well maintained. No evidence for acute or chronic fracture. Signal intensity within the vertebral body bone marrow is normal. Few scattered benign hemangiomas noted, most notable within the anterior aspect of the T4 vertebral body. No worrisome osseous lesions. No abnormal marrow edema or enhancement. Cord:  Signal intensity within the thoracic spinal cord is normal. Paraspinal and other soft tissues: Paraspinous soft tissues within normal limits. Partially visualized lungs are grossly clear. Visualized visceral structures unremarkable. Disc levels: T6-7: Small right paracentral disc protrusion flattens the right ventral thecal sac (series 24, image 20). Associated osseous ridging. No  significant stenosis. T8-9: Left paracentral disc protrusion indents the left ventral thecal sac (series 24, image 25). No significant stenosis. No other significant degenerative changes within the thoracic spine. No canal or foraminal stenosis. MRI LUMBAR SPINE FINDINGS Segmentation: Normal segmentation. Lowest well-formed disc labeled the L5-S1 level. Alignment: Trace retrolisthesis of L4 on L5. Trace anterolisthesis of L5 on S1. Vertebral bodies otherwise normally aligned with preservation of the normal lumbar lordosis. Vertebrae: Vertebral body heights well maintained. No evidence for acute or chronic fracture. Bone marrow signal intensity within normal limits. Few scattered benign subcentimeter hemangiomas noted.  No worrisome osseous lesions. No abnormal marrow edema or enhancement. Conus medullaris: Extends to the L1 level and appears normal. Paraspinal and other soft tissues: Paraspinous soft tissues within normal limits. Tiny T2 hyperintense simple cysts noted within the right kidney. Visualized visceral structures otherwise unremarkable. Disc levels: No significant degenerative changes seen through the L3-4 level. L4-5: Trace retrolisthesis. Disc desiccation with shallow broad posterior disc bulge. No stenosis or neural impingement. L5-S1: Trace anterolisthesis. No significant disc bulge. No canal or foraminal stenosis. No neural impingement. IMPRESSION: MRI BRAIN IMPRESSION: Normal MRI of the brain. No acute intracranial abnormality identified. MRI CERVICAL SPINE IMPRESSION: 1. Mild disc bulging at C3-4 and C4-5 without significant stenosis. 2. Otherwise normal MRI of the cervical spine. MRI THORACIC SPINE IMPRESSION: 1. Small disc protrusions at T6-7 and T8-9 without significant stenosis as above. 2. Otherwise normal MRI of the thoracic spine. MRI LUMBAR SPINE IMPRESSION: 1. Mild degenerative disc bulging at L4-5 without stenosis or neural impingement. 2. Otherwise unremarkable MRI of the lumbar spine.  Electronically Signed   By: Rise Mu M.D.   On: 08/15/2017 22:59   Mr Lumbar Spine W Wo Contrast  Result Date: 08/15/2017 CLINICAL DATA:  Initial evaluation for acute tremors, vision changes EXAM: MRI TOTAL SPINE WITHOUT AND WITH CONTRAST TECHNIQUE: Multisequence MR imaging of the brain and surrounding structures was performed prior to and following contrast administration. Multisequence MR imaging of the spine from the cervical spine to the sacrum was performed prior to and following IV contrast administration for evaluation of spinal metastatic disease. CONTRAST:  8mL MULTIHANCE GADOBENATE DIMEGLUMINE 529 MG/ML IV SOLN COMPARISON:  Prior head CT from earlier the same day. FINDINGS: MRI HEAD FINDINGS Brain: Mild age-related cerebral volume loss. No focal parenchymal signal abnormality. No significant cerebral white matter disease for age. No abnormal foci of restricted diffusion to suggest acute or subacute ischemia. No encephalomalacia to suggest chronic infarction. No solid artifact suggest acute or chronic intracranial hemorrhage. No mass lesion, midline shift or mass effect. No hydrocephalus. No extra-axial fluid collection. Major dural sinuses patent. No abnormal enhancement. Pituitary gland suprasellar region normal. Midline structures intact and normal. Vascular:  Major intracranial vascular flow voids well maintained. Skull base, calvarium, and soft tissues: Craniocervical junction normal. Bone marrow signal intensity within normal limits. Scalp soft tissues unremarkable. Orbits/sinues: Globes and orbital soft tissues within normal limits. Paranasal sinuses are clear. No mastoid effusion. Inner ear structures normal. MRI CERVICAL SPINE FINDINGS Alignment: Vertebral bodies normally aligned with preservation of the normal cervical lordosis. No listhesis. Vertebrae: Vertebral body heights well maintained. No evidence for acute or chronic fracture. Bone marrow signal intensity within normal  limits. No discrete or worrisome osseous lesions. No abnormal marrow edema or enhancement. Cord: Signal intensity within the cervical spinal cord is normal. Posterior Fossa, vertebral arteries, paraspinal tissues: Paraspinous soft tissues within normal limits. Normal intravascular flow voids present within the vertebral arteries bilaterally. Disc levels: C2-C3: Unremarkable. C3-C4:  Mild degenerative disc bulge.  No stenosis. C4-C5: Shallow central disc protrusion mildly indents the ventral thecal sac. No significant canal or foraminal stenosis. C5-C6:  Unremarkable. C6-C7:  Unremarkable. C7-T1:  Unremarkable. MRI THORACIC SPINE FINDINGS Alignment: Vertebral bodies normally aligned with preservation of the normal thoracic kyphosis. No listhesis. Vertebrae: Vertebral body heights well maintained. No evidence for acute or chronic fracture. Signal intensity within the vertebral body bone marrow is normal. Few scattered benign hemangiomas noted, most notable within the anterior aspect of the T4 vertebral body. No worrisome osseous lesions. No abnormal  marrow edema or enhancement. Cord:  Signal intensity within the thoracic spinal cord is normal. Paraspinal and other soft tissues: Paraspinous soft tissues within normal limits. Partially visualized lungs are grossly clear. Visualized visceral structures unremarkable. Disc levels: T6-7: Small right paracentral disc protrusion flattens the right ventral thecal sac (series 24, image 20). Associated osseous ridging. No significant stenosis. T8-9: Left paracentral disc protrusion indents the left ventral thecal sac (series 24, image 25). No significant stenosis. No other significant degenerative changes within the thoracic spine. No canal or foraminal stenosis. MRI LUMBAR SPINE FINDINGS Segmentation: Normal segmentation. Lowest well-formed disc labeled the L5-S1 level. Alignment: Trace retrolisthesis of L4 on L5. Trace anterolisthesis of L5 on S1. Vertebral bodies otherwise  normally aligned with preservation of the normal lumbar lordosis. Vertebrae: Vertebral body heights well maintained. No evidence for acute or chronic fracture. Bone marrow signal intensity within normal limits. Few scattered benign subcentimeter hemangiomas noted. No worrisome osseous lesions. No abnormal marrow edema or enhancement. Conus medullaris: Extends to the L1 level and appears normal. Paraspinal and other soft tissues: Paraspinous soft tissues within normal limits. Tiny T2 hyperintense simple cysts noted within the right kidney. Visualized visceral structures otherwise unremarkable. Disc levels: No significant degenerative changes seen through the L3-4 level. L4-5: Trace retrolisthesis. Disc desiccation with shallow broad posterior disc bulge. No stenosis or neural impingement. L5-S1: Trace anterolisthesis. No significant disc bulge. No canal or foraminal stenosis. No neural impingement. IMPRESSION: MRI BRAIN IMPRESSION: Normal MRI of the brain. No acute intracranial abnormality identified. MRI CERVICAL SPINE IMPRESSION: 1. Mild disc bulging at C3-4 and C4-5 without significant stenosis. 2. Otherwise normal MRI of the cervical spine. MRI THORACIC SPINE IMPRESSION: 1. Small disc protrusions at T6-7 and T8-9 without significant stenosis as above. 2. Otherwise normal MRI of the thoracic spine. MRI LUMBAR SPINE IMPRESSION: 1. Mild degenerative disc bulging at L4-5 without stenosis or neural impingement. 2. Otherwise unremarkable MRI of the lumbar spine. Electronically Signed   By: Rise Mu M.D.   On: 08/15/2017 22:59    Discharge Exam: Vitals:   08/17/17 0018 08/17/17 0635  BP: (!) 88/53 (!) 88/48  Pulse: 78 65  Resp: 18 18  Temp: 98.6 F (37 C) (!) 97.5 F (36.4 C)  SpO2: 99% 100%   Vitals:   08/16/17 0532 08/16/17 1400 08/17/17 0018 08/17/17 0635  BP: (!) 84/46 (!) 90/53 (!) 88/53 (!) 88/48  Pulse: 69 80 78 65  Resp:  18 18 18   Temp: (!) 97.4 F (36.3 C) 98.6 F (37 C) 98.6  F (37 C) (!) 97.5 F (36.4 C)  TempSrc: Oral Oral Oral Oral  SpO2: 100% 100% 99% 100%  Weight:      Height:        General: Pt is alert, awake, not in acute distress Cardiovascular: RRR, S1/S2 +, no rubs, no gallops Respiratory: CTA bilaterally, no wheezing, no rhonchi Abdominal: Soft, NT, ND, bowel sounds + Extremities: no edema, no cyanosis    The results of significant diagnostics from this hospitalization (including imaging, microbiology, ancillary and laboratory) are listed below for reference.     Microbiology: No results found for this or any previous visit (from the past 240 hour(s)).   Labs: BNP (last 3 results) No results for input(s): BNP in the last 8760 hours. Basic Metabolic Panel:  Recent Labs Lab 08/15/17 1726 08/17/17 0602  NA 137 139  K 3.6 3.9  CL 109 108  CO2 20* 22  GLUCOSE 82 91  BUN 20 19  CREATININE 1.06* 1.05*  CALCIUM 8.8* 8.6*   Liver Function Tests:  Recent Labs Lab 08/15/17 1726  AST 16  ALT 17  ALKPHOS 29*  BILITOT 0.5  PROT 6.5  ALBUMIN 3.6   No results for input(s): LIPASE, AMYLASE in the last 168 hours. No results for input(s): AMMONIA in the last 168 hours. CBC:  Recent Labs Lab 08/15/17 1726 08/17/17 0602  WBC 5.5 4.9  NEUTROABS 3.4  --   HGB 11.5* 11.5*  HCT 34.9* 35.4*  MCV 89.5 92.4  PLT 265 228   Cardiac Enzymes: No results for input(s): CKTOTAL, CKMB, CKMBINDEX, TROPONINI in the last 168 hours. BNP: Invalid input(s): POCBNP CBG: No results for input(s): GLUCAP in the last 168 hours. D-Dimer No results for input(s): DDIMER in the last 72 hours. Hgb A1c No results for input(s): HGBA1C in the last 72 hours. Lipid Profile No results for input(s): CHOL, HDL, LDLCALC, TRIG, CHOLHDL, LDLDIRECT in the last 72 hours. Thyroid function studies  Recent Labs  08/15/17 1726  TSH 2.955   Anemia work up No results for input(s): VITAMINB12, FOLATE, FERRITIN, TIBC, IRON, RETICCTPCT in the last 72  hours. Urinalysis    Component Value Date/Time   COLORURINE YELLOW 08/16/2017 0529   APPEARANCEUR CLEAR 08/16/2017 0529   LABSPEC 1.010 08/16/2017 0529   PHURINE 6.0 08/16/2017 0529   GLUCOSEU NEGATIVE 08/16/2017 0529   HGBUR NEGATIVE 08/16/2017 0529   BILIRUBINUR NEGATIVE 08/16/2017 0529   KETONESUR NEGATIVE 08/16/2017 0529   PROTEINUR NEGATIVE 08/16/2017 0529   NITRITE NEGATIVE 08/16/2017 0529   LEUKOCYTESUR NEGATIVE 08/16/2017 0529   Sepsis Labs Invalid input(s): PROCALCITONIN,  WBC,  LACTICIDVEN Microbiology No results found for this or any previous visit (from the past 240 hour(s)).   Time coordinating discharge: Over 30 minutes  SIGNED:   Erick Blinks, MD  [facility] 08/17/2017, 2:10 PM Pager [phone]  If 7PM-7AM, please contact night-coverage [email or web address] Password [email or web address]

## 2017-08-20 ENCOUNTER — Ambulatory Visit (HOSPITAL_COMMUNITY): Payer: Self-pay | Admitting: Psychology

## 2017-08-20 DIAGNOSIS — Z23 Encounter for immunization: Secondary | ICD-10-CM | POA: Diagnosis not present

## 2017-08-20 DIAGNOSIS — B351 Tinea unguium: Secondary | ICD-10-CM | POA: Diagnosis not present

## 2017-08-20 DIAGNOSIS — N951 Menopausal and female climacteric states: Secondary | ICD-10-CM | POA: Diagnosis not present

## 2017-08-20 DIAGNOSIS — R634 Abnormal weight loss: Secondary | ICD-10-CM | POA: Diagnosis not present

## 2017-09-03 ENCOUNTER — Ambulatory Visit (HOSPITAL_COMMUNITY): Payer: Self-pay | Admitting: Psychology

## 2017-09-17 ENCOUNTER — Ambulatory Visit (INDEPENDENT_AMBULATORY_CARE_PROVIDER_SITE_OTHER): Payer: PPO | Admitting: Psychology

## 2017-09-17 DIAGNOSIS — F5000 Anorexia nervosa, unspecified: Secondary | ICD-10-CM

## 2017-09-17 DIAGNOSIS — F3130 Bipolar disorder, current episode depressed, mild or moderate severity, unspecified: Secondary | ICD-10-CM | POA: Diagnosis not present

## 2017-09-17 NOTE — Progress Notes (Signed)
   THERAPIST PROGRESS NOTE  Session Time: 12.30pm-1.10pm  Participation Level: Active  Behavioral Response: Well GroomedAlertAnxious  Type of Therapy: Individual Therapy  Treatment Goals addressed: Diagnosis: Anorexia, Bipolar D/O and goal 1.  Interventions: CBT and Supportive  Summary: Annette Jeffie PollockKelly Sanchez is a 46 y.o. female who presents with affect anxious.  Pt reported she feels so much better since last visit. Pt reported she doesn't feel disoriented or confused.  Pt reported that at the hospital they didn't find any wrong- but was concerned that between hospital, primary care and psychiatrist that no one is expressing concern about her weight and that makes her think ok what she is doing. Pt contributes her symptoms to being malnourished when last seen.  Pt reported that she wasn't eating well and pt reports that her most recent weight is 88lbs. Pt was able to acknowledge that hospital and other have been concerned about eating/weight as is dx w/ anorexia and asked at appointments about her eating habits and that hospital set up w/ nutritionist that wanted her to take ensure w/ every meal.  Pt acknowledges that she is struggling w/ eating disorder- thinking that current weight and good and doesn't want to gain.  Pt also acknowledges that she plays role in her tx either being truthful or minimizing. Pt discussed feeling more confidence in past and not needy.  Pt does report that she is feeling better w/ eating 3 small meals and drinking ensure w/ each meal. Pt agrees to inform PCP at next visit about eating disorder and get referral for nutritionist.    Suicidal/Homicidal: Nowithout intent/plan  Therapist Response: Assessed pt current functioning per pt report. Processed w/pt her results from ER and her concerns re: providers not mentioning underweight.  Assisted pt w/ awareness of actions of providers that did show they were concerned.  Discussed her self image and behaviors of restricting to  decrease or not gain.  Encouraged pt to be open and honest about her habits and eating disorder w/ all her providers.  Gave information re: nutritionist Wyona AlmasJeannie Sykes.    Plan: Return again in 1-2 weeks. F/u w/ PCP and psychiatrist and inform of eating disorder symptoms that have been active.    Diagnosis: Anorexia Nervosa and Bipolar 1 d/O    Jaymes Hang, LPC 09/17/2017

## 2017-09-23 DIAGNOSIS — E785 Hyperlipidemia, unspecified: Secondary | ICD-10-CM | POA: Diagnosis not present

## 2017-09-23 DIAGNOSIS — B351 Tinea unguium: Secondary | ICD-10-CM | POA: Diagnosis not present

## 2017-09-23 DIAGNOSIS — Z5181 Encounter for therapeutic drug level monitoring: Secondary | ICD-10-CM | POA: Diagnosis not present

## 2017-09-23 DIAGNOSIS — M6281 Muscle weakness (generalized): Secondary | ICD-10-CM | POA: Diagnosis not present

## 2017-09-23 DIAGNOSIS — Z3041 Encounter for surveillance of contraceptive pills: Secondary | ICD-10-CM | POA: Diagnosis not present

## 2017-10-01 ENCOUNTER — Ambulatory Visit (HOSPITAL_COMMUNITY): Payer: Self-pay | Admitting: Psychology

## 2017-10-08 DIAGNOSIS — J0100 Acute maxillary sinusitis, unspecified: Secondary | ICD-10-CM | POA: Diagnosis not present

## 2017-10-15 ENCOUNTER — Ambulatory Visit (HOSPITAL_COMMUNITY): Payer: Self-pay | Admitting: Psychology

## 2017-11-07 ENCOUNTER — Ambulatory Visit (HOSPITAL_COMMUNITY): Payer: PPO | Admitting: Psychiatry

## 2017-11-07 ENCOUNTER — Encounter (HOSPITAL_COMMUNITY): Payer: Self-pay | Admitting: Psychiatry

## 2017-11-07 DIAGNOSIS — F5105 Insomnia due to other mental disorder: Secondary | ICD-10-CM | POA: Diagnosis not present

## 2017-11-07 DIAGNOSIS — F3130 Bipolar disorder, current episode depressed, mild or moderate severity, unspecified: Secondary | ICD-10-CM

## 2017-11-07 DIAGNOSIS — F5000 Anorexia nervosa, unspecified: Secondary | ICD-10-CM

## 2017-11-07 DIAGNOSIS — F411 Generalized anxiety disorder: Secondary | ICD-10-CM | POA: Diagnosis not present

## 2017-11-07 DIAGNOSIS — Z818 Family history of other mental and behavioral disorders: Secondary | ICD-10-CM | POA: Diagnosis not present

## 2017-11-07 DIAGNOSIS — F99 Mental disorder, not otherwise specified: Secondary | ICD-10-CM

## 2017-11-07 MED ORDER — TOPIRAMATE 50 MG PO TABS: 75.0000 mg | ORAL_TABLET | Freq: Every day | ORAL | 0 refills | Status: AC

## 2017-11-07 MED ORDER — CLONAZEPAM 1 MG PO TABS: 1.0000 mg | ORAL_TABLET | Freq: Every day | ORAL | 0 refills | Status: AC

## 2017-11-07 MED ORDER — FLUOXETINE HCL 40 MG PO CAPS: 80.0000 mg | ORAL_CAPSULE | Freq: Every day | ORAL | 0 refills | Status: AC

## 2017-11-07 MED ORDER — BUPROPION HCL ER (XL) 150 MG PO TB24: 450.0000 mg | ORAL_TABLET | Freq: Every day | ORAL | 0 refills | Status: AC

## 2017-11-07 MED ORDER — ZIPRASIDONE HCL 40 MG PO CAPS: 40.0000 mg | ORAL_CAPSULE | Freq: Every day | ORAL | 0 refills | Status: AC

## 2017-11-07 MED ORDER — TRAZODONE HCL 100 MG PO TABS: 100.0000 mg | ORAL_TABLET | Freq: Every evening | ORAL | 0 refills | Status: AC | PRN

## 2017-11-07 NOTE — Progress Notes (Signed)
BH MD/PA/NP OP Progress Note  11/07/2017 2:10 PM Annette Jeffie PollockKelly Sanchez  MRN:  [id number]  Chief Complaint:  Chief Complaint    Depression; Follow-up     HPI: She is taking a new medication for a fungal infection on her toe but does not recall the name.   "I'm doing well. I really believe when I was here last I was having a breakdown. I am much better now". She states she had symptoms like that in the past. She was not feeling safe- not eating or drinking water. At that time she was overwhelmed, unable to handle any stress, tachycardia with palpitations and shaking. She was letting herself shut down. She was taking her meds but they weren't helping. What really helped was being in the hospital for 3 days. She felt she got time off and got to rest.  She is eating 3 meals a day. Pt is drinking Ensure with breakfast which is 1 piece of toast. She is eating lunch and dinner- grilling a lot of meat.   Mood has improved. She is no longer feeling panicky. Anxiety is present but not as severe as before. At night she has a lot of more anxiety. The thought of going to bed brings on anxiety. She is getting about 6 hrs of sleep and waking up multiple times a night. She is usually able to fall back to asleep. She is has been taking 100mg  of Trazodone. It causes her to feel very fatigued in the mornings. She is taking a nap after breakfast.  Pt is not working or volunteering and she wishes she was but has no desire to start like she used to. Pt is endorsing anhedonia, isolation and feeling withdrawn. She does make an effort to socialize slowly. Depression is present- she feels like she is not living to her full potential. Pt finds herself sitting all day. Pt denies SI/HI.   Pt denies manic and hypomanic symptoms including periods of decreased need for sleep, increased energy, mood lability, impulsivity, FOI, and excessive spending.  Pt is taking meds as prescribed.   Visit Diagnosis:    ICD-10-CM   1. Bipolar I  disorder, most recent episode depressed (HCC) F31.30 buPROPion (WELLBUTRIN XL) 150 MG 24 hr tablet    FLUoxetine (PROZAC) 40 MG capsule    ziprasidone (GEODON) 40 MG capsule    topiramate (TOPAMAX) 50 MG tablet  2. GAD (generalized anxiety disorder) F41.1 clonazePAM (KLONOPIN) 1 MG tablet    FLUoxetine (PROZAC) 40 MG capsule  3. Insomnia due to other mental disorder F51.05 clonazePAM (KLONOPIN) 1 MG tablet   F99 traZODone (DESYREL) 100 MG tablet  4. Anorexia nervosa F50.00 FLUoxetine (PROZAC) 40 MG capsule    Past Psychiatric History:  Anxiety: Yes Bipolar Disorder: Yes Depression: No Mania: No Psychosis: No Schizophrenia: No Personality Disorder: No Hospital[facility] for psychiatric illness: [facility], reports hx of multiple previous hospitalizations.  History of Electroconvulsive Shock Therapy: No Prior Suicide Attempts: No   Past Medical History:  Past Medical History:  Diagnosis Date  . Bipolar 1 disorder (HCC)   . Eating disorder   . Thyroid disease     Past Surgical History:  Procedure Laterality Date  . LEEP    . MULTIPLE TOOTH EXTRACTIONS    . TYMPANOSTOMY TUBE PLACEMENT      Family Psychiatric History: Family History  Problem Relation Age of Onset  . Schizophrenia Father   . Bipolar disorder Father   . Schizophrenia Paternal Grandmother   .  Breast cancer Mother 64  . Pancreatic cancer Maternal Aunt   . Schizophrenia Paternal Grandfather   . Heart attack Maternal Grandfather   . Suicidality Neg Hx     Social History:  Social History   Socioeconomic History  . Marital status: Married    Spouse name: None  . Number of children: None  . Years of education: None  . Highest education level: None  Social Needs  . Financial resource strain: None  . Food insecurity - worry: None  . Food insecurity - inability: None  . Transportation needs - medical: None  . Transportation needs - non-medical: None  Occupational History  . None   Tobacco Use  . Smoking status: Never Smoker  . Smokeless tobacco: Never Used  Substance and Sexual Activity  . Alcohol use: No  . Drug use: No  . Sexual activity: Yes    Partners: Male    Birth control/protection: Pill  Other Topics Concern  . None  Social History Narrative  . None    Allergies:  Allergies  Allergen Reactions  . Kasandra Knudsen [Lurasidone Hcl] Nausea And Vomiting    Complete GI upset/ nausea, vomiting and diarrhea.   . Sulfa Antibiotics Nausea And Vomiting    Metabolic Disorder Labs: No results found for: HGBA1C, MPG No results found for: PROLACTIN No results found for: CHOL, TRIG, HDL, CHOLHDL, VLDL, LDLCALC Lab Results  Component Value Date   TSH 2.955 08/15/2017    Therapeutic Level Labs: No results found for: LITHIUM No results found for: VALPROATE No components found for:  CBMZ  Current Medications: Current Outpatient Medications  Medication Sig Dispense Refill  . buPROPion (WELLBUTRIN XL) 150 MG 24 hr tablet Take 3 tablets (450 mg total) by mouth daily. 270 tablet 0  . clonazePAM (KLONOPIN) 1 MG tablet Take 1 tablet (1 mg total) by mouth daily. 90 tablet 0  . FLUoxetine (PROZAC) 40 MG capsule Take 2 capsules (80 mg total) by mouth daily. 180 capsule 0  . Multiple Vitamin (MULTIVITAMIN WITH MINERALS) TABS tablet Take 1 tablet by mouth daily.    . Norgestimate-Ethinyl Estradiol Triphasic (ORTHO TRI-CYCLEN LO) 0.18/0.215/0.25 MG-25 MCG tab Take 1 tablet by mouth daily.    Marland Kitchen topiramate (TOPAMAX) 50 MG tablet Take 1.5 tablets (75 mg total) by mouth at bedtime. 135 tablet 0  . traZODone (DESYREL) 100 MG tablet Take 1 tablet (100 mg total) by mouth at bedtime as needed for sleep. 90 tablet 0  . ziprasidone (GEODON) 40 MG capsule Take 1 capsule (40 mg total) by mouth at bedtime. 90 capsule 0  . Melatonin 10 MG TBDP Take 10 mg by mouth at bedtime as needed (sleep).      No current facility-administered medications for this visit.       Musculoskeletal: Strength & Muscle Tone: within normal limits Gait & Station: normal Patient leans: N/A  Psychiatric Specialty Exam: Review of Systems  Constitutional: Negative for chills, fever and malaise/fatigue.  HENT: Negative for congestion, ear pain, sinus pain and sore throat.   Neurological: Negative for weakness.    There were no vitals taken for this visit.There is no height or weight on file to calculate BMI.  General Appearance: Casual  Eye Contact:  Fair  Speech:  Slow  Volume:  Normal  Mood:  Anxious  Affect:  Blunt and Congruent  Thought Process:  Goal Directed and Descriptions of Associations: Intact  Orientation:  Full (Time, Place, and Person)  Thought Content: Logical   Suicidal Thoughts:  No  Homicidal Thoughts:  No  Memory:  Immediate;   Good Recent;   Good Remote;   Good  Judgement:  Fair  Insight:  Present  Psychomotor Activity:  Normal  Concentration:  Concentration: Good and Attention Span: Good  Recall:  Good  Fund of Knowledge: Good  Language: Good  Akathisia:  No  Handed:  Right  AIMS (if indicated): not done  Assets:  Communication Skills Desire for Improvement  ADL's:  Intact  Cognition: WNL  Sleep:  Good   Screenings:   Assessment and Plan: Bipolar 1 disorder; GAD; anorexia nervosa; insomnia    Medication management with supportive therapy. Risks/benefits and SE of the medication discussed. Pt verbalized understanding and verbal consent obtained for treatment.  Affirm with the patient that the medications are taken as ordered. Patient expressed understanding of how their medications were to be used.   Meds:  1. Wellbutrin XL 450mg  po qD. Pt reports last seizure was in 1997 and she has tolerated Wellbutrin without SE or AR and would like to continue taking it 2. Prozac 80mh po qD for depression and anxiety 3. Geodon 40mg  po qD for Bipolar disorder 4. Melatonin OCT prn for sleep 5. decreaseTopamax 75mg  po qHS for Bipolar  disorder- pt started this med in 1997 for her seizures. Will decrease due to sedation/fatigue 6. Decrease Klonopin 1mg  po qHS prn anxiety as pt has only been taking one tab a day for several months 7.  increase Trazodone 100mg  po qHS prn insomnia Pt has tolerated and is benefiting from treatment with 2 antipsychotics.   Labs: reviewed labs done on 08/17/17 WNL and EKG on 08/16/17- normal sinus rhythm and QTc 450 08/16/17 EEG report-  Diffuse low voltage beta activity is commonly seen with sedating medications such as benzodiazepines. In the absence of sedating medications, anxiety and hyperthyroidism may produce generalized beta activity. The absence of epileptiform discharges does not exclude a clinical diagnosis of epilepsy. If further clinical questions remain, prolonged EEG may be helpful. Clinical correlation is advised.   Therapy: brief supportive therapy provided. Discussed psychosocial stressors in detail.    Consultations: Encouraged to follow up with therapist Encouraged to follow up with PCP as needed  Pt denies SI and is at an acute low risk for suicide. Patient told to call clinic if any problems occur. Patient advised to go to ER if they should develop SI/HI, side effects, or if symptoms worsen. Has crisis numbers to call if needed. Pt verbalized understanding.  F/up in 2 months or sooner if needed  Oletta DarterSalina Shayne Diguglielmo, MD 11/07/2017, 2:10 PM

## 2018-01-07 DIAGNOSIS — F3132 Bipolar disorder, current episode depressed, moderate: Secondary | ICD-10-CM | POA: Diagnosis not present

## 2018-01-09 ENCOUNTER — Ambulatory Visit (HOSPITAL_COMMUNITY): Payer: Self-pay | Admitting: Psychiatry

## 2018-01-13 DIAGNOSIS — S0500XA Injury of conjunctiva and corneal abrasion without foreign body, unspecified eye, initial encounter: Secondary | ICD-10-CM | POA: Diagnosis not present

## 2018-01-13 DIAGNOSIS — H18213 Corneal edema secondary to contact lens, bilateral: Secondary | ICD-10-CM | POA: Diagnosis not present

## 2018-01-16 DIAGNOSIS — F3131 Bipolar disorder, current episode depressed, mild: Secondary | ICD-10-CM | POA: Diagnosis not present

## 2018-02-18 DIAGNOSIS — F3131 Bipolar disorder, current episode depressed, mild: Secondary | ICD-10-CM | POA: Diagnosis not present

## 2018-03-19 DIAGNOSIS — F3132 Bipolar disorder, current episode depressed, moderate: Secondary | ICD-10-CM | POA: Diagnosis not present

## 2018-04-28 ENCOUNTER — Encounter (HOSPITAL_COMMUNITY): Payer: Self-pay | Admitting: Psychology

## 2018-04-28 NOTE — Progress Notes (Signed)
[name] is a 47 y.o. female patient who is discharged from counseling as last seen on 09/17/17.  Outpatient Therapist Discharge Summary  [name]    [birth year]   Admission Date: 03/20/17   Discharge Date:  04/28/18 Reason for Discharge:  Not active Diagnosis:  Anorexia Nervosa Bipolar 1 d/o Comments:  Pt needs referral to eating d/o specialist if seeking further counseling  Alfredo BattyLeanne M Gunda Maqueda          Jolicia Delira, [facility]

## 2018-05-15 DIAGNOSIS — F3132 Bipolar disorder, current episode depressed, moderate: Secondary | ICD-10-CM | POA: Diagnosis not present

## 2018-06-12 DIAGNOSIS — F3174 Bipolar disorder, in full remission, most recent episode manic: Secondary | ICD-10-CM | POA: Diagnosis not present

## 2018-06-17 DIAGNOSIS — Z803 Family history of malignant neoplasm of breast: Secondary | ICD-10-CM | POA: Diagnosis not present

## 2018-06-17 DIAGNOSIS — Z1231 Encounter for screening mammogram for malignant neoplasm of breast: Secondary | ICD-10-CM | POA: Diagnosis not present

## 2018-08-06 DIAGNOSIS — F3174 Bipolar disorder, in full remission, most recent episode manic: Secondary | ICD-10-CM | POA: Diagnosis not present

## 2018-08-13 DIAGNOSIS — H5213 Myopia, bilateral: Secondary | ICD-10-CM | POA: Diagnosis not present

## 2018-08-27 IMAGING — MR MR HEAD WO/W CM
15 of 35 series · 23 of 48 positions shown · IV contrast (multihance)
Comparison: Prior head CT from earlier the same day.

CLINICAL DATA: Initial evaluation for acute tremors, vision changes

EXAM:
MRI TOTAL SPINE WITHOUT AND WITH CONTRAST
TECHNIQUE: Multisequence MR imaging of the brain and surrounding structures was
performed prior to and following contrast administration.
Multisequence MR imaging of the spine from the cervical spine to the
sacrum was performed prior to and following IV contrast
administration for evaluation of spinal metastatic disease.
CONTRAST:  8mL MULTIHANCE GADOBENATE DIMEGLUMINE 529 MG/ML IV SOLN

[Series 3: DWI · axial · 3.0mm · 1.09mm/px · z∈[-82,+65]mm · 4 of 100 slices shown (1 of 4)]
[im 1/100]
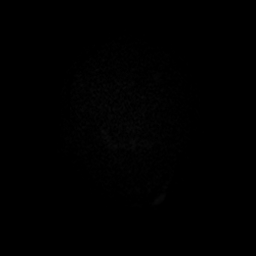
[im 34/100]
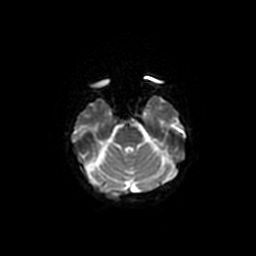
[im 67/100]
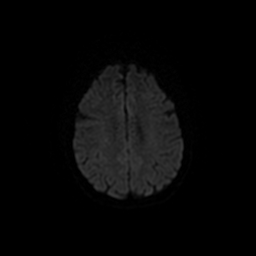
[im 100/100]
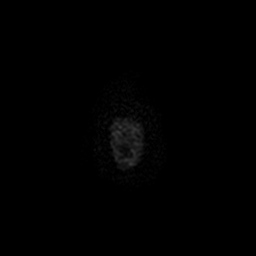

[Series 5: DWI · coronal · 5.0mm · 1.09mm/px · 2 of 68 slices shown (2 of 4)]
[im 1/68]
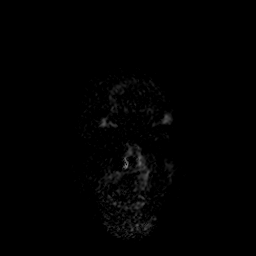
[im 68/68]
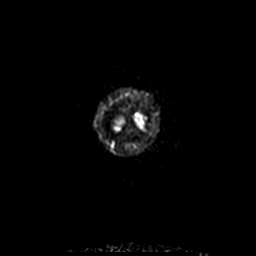

[Series 6: T2 · axial · 5.0mm · 0.43mm/px · 1 of 23 slices shown (1 of 4)]
[im 1/23]
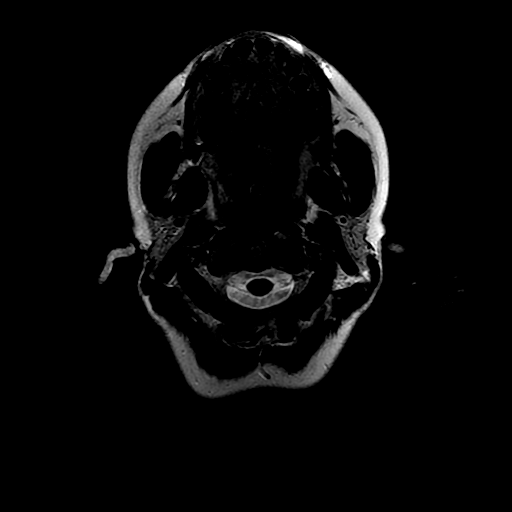

[Series 7: FLAIR · axial · 3.0mm · 0.43mm/px · 1 of 27 slices shown]
[im 1/27]
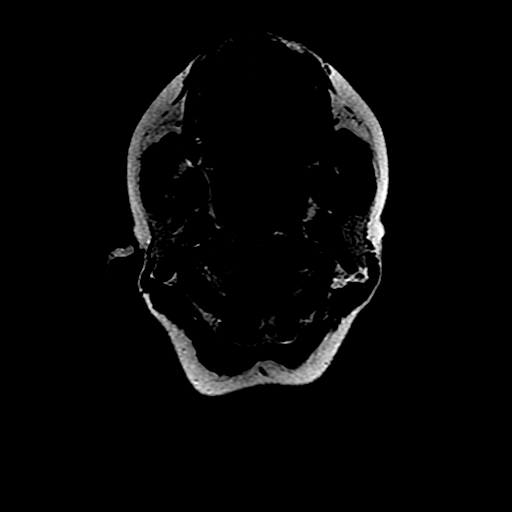

[Series 10: T2 · coronal · 5.0mm · 0.45mm/px · 1 of 27 slices shown (2 of 4)]
[im 1/27]
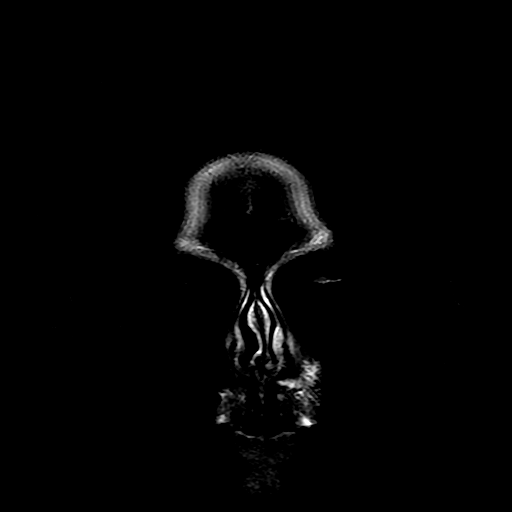

[Series 14: T2 · sagittal · 3.0mm · 0.41mm/px · 1 of 12 slices shown (3 of 4)]
[im 1/12]
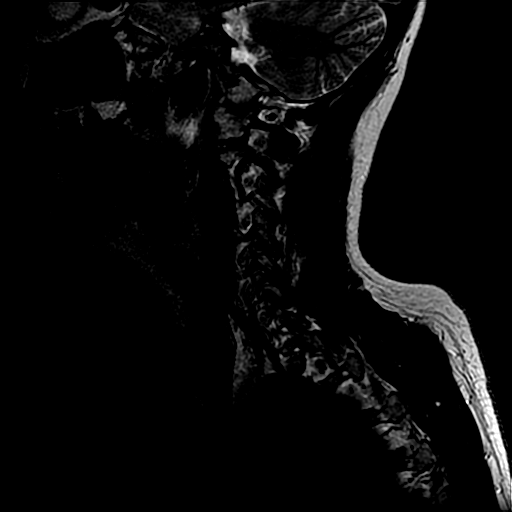

[Series 16: T2 · axial · 3.0mm · 0.35mm/px · 1 of 29 slices shown (4 of 4)]
[im 1/29]
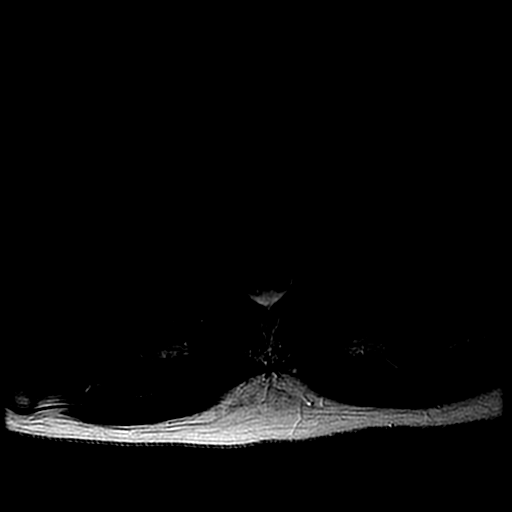

[Series 33: T1 post-contrast · sagittal · 4.0mm · 0.51mm/px · 1 of 12 slices shown (1 of 6)]
[im 1/12]
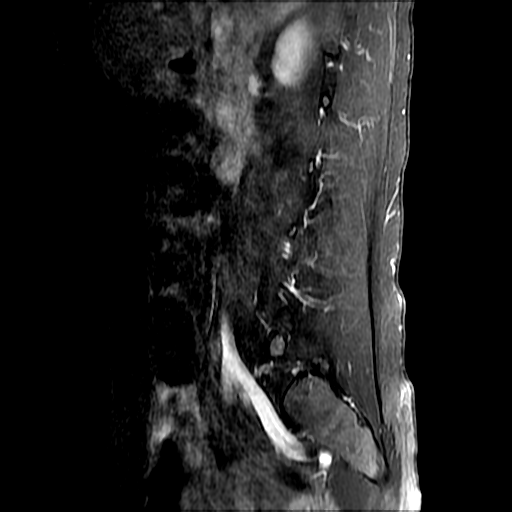

[Series 34: T1 post-contrast · axial · 4.0mm · 0.39mm/px · z∈[-608,-420]mm · 2 of 31 slices shown (2 of 6)]
[im 1/31]
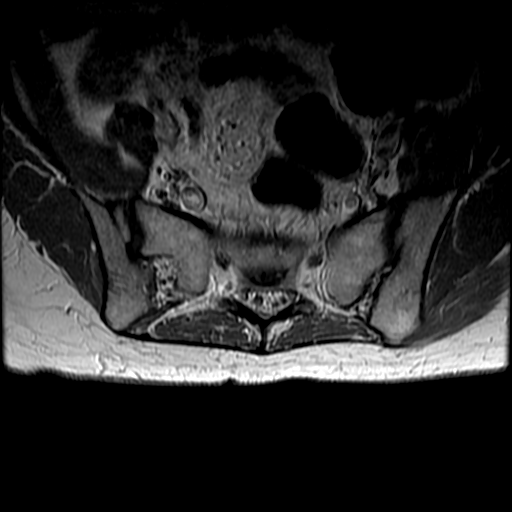
[im 31/31]
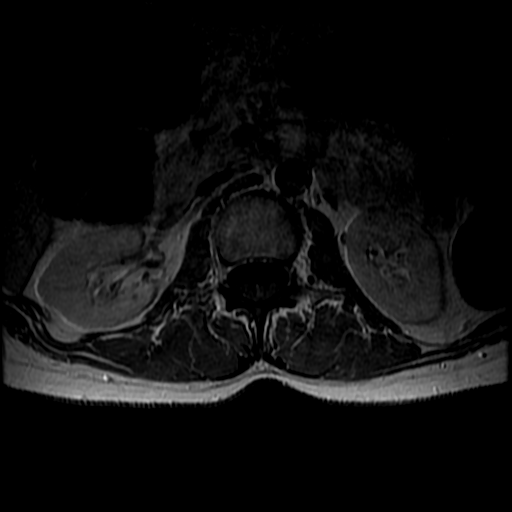

[Series 35: T1 post-contrast · sagittal · 3.0mm · 0.62mm/px · 1 of 13 slices shown (3 of 6)]
[im 1/13]
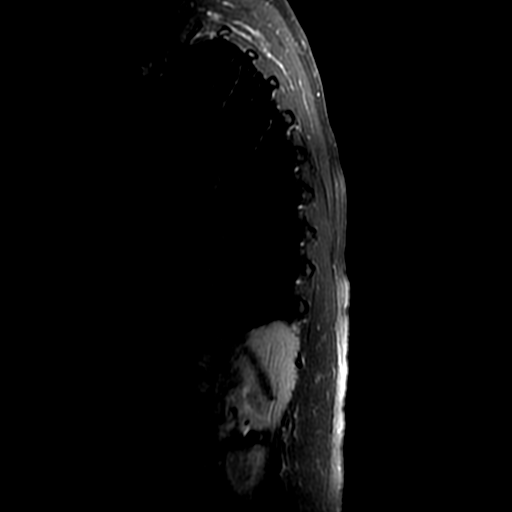

[Series 36: T1 post-contrast · axial · 4.0mm · 0.39mm/px · z∈[-404,-192]mm · 2 of 39 slices shown (4 of 6)]
[im 1/39]
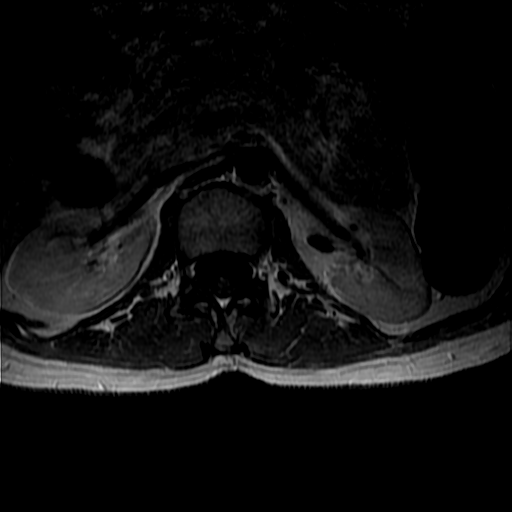
[im 39/39]
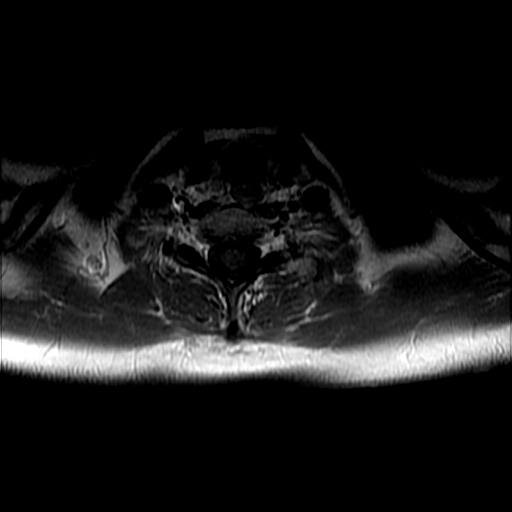

[Series 38: T1 post-contrast · axial · 3.0mm · 0.35mm/px · 1 of 29 slices shown (5 of 6)]
[im 1/29]
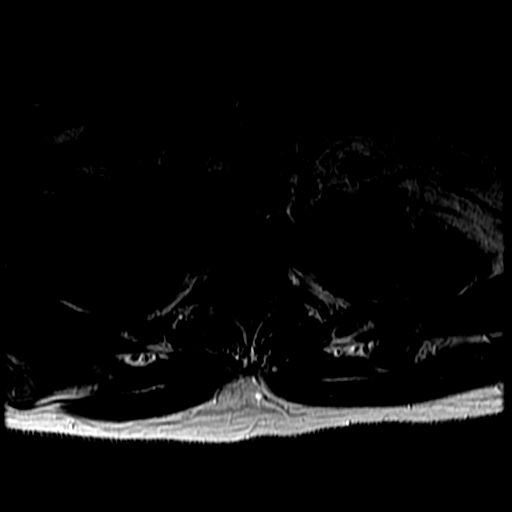

[Series 40: T1 post-contrast · coronal · 5.0mm · 0.45mm/px · 1 of 27 slices shown (6 of 6)]
[im 1/27]
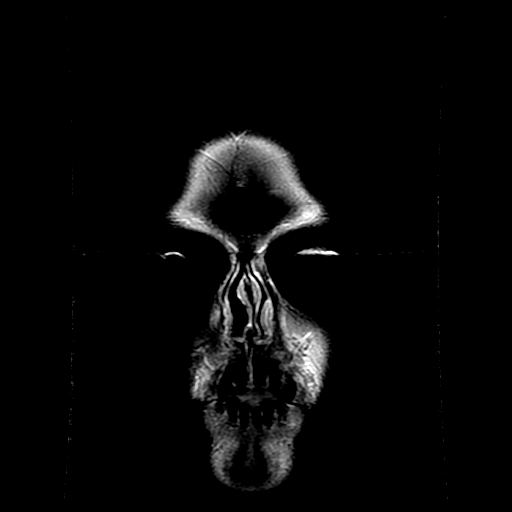

[Series 300: DWI · axial · 3.0mm · 1.09mm/px · z∈[-82,+65]mm · 2 of 50 slices shown (3 of 4)]
[im 1/50]
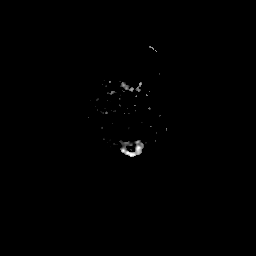
[im 50/50]
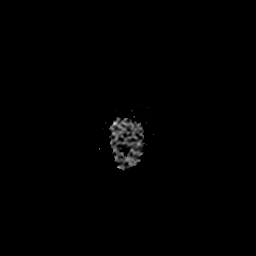

[Series 500: DWI · coronal · 5.0mm · 1.09mm/px · 2 of 34 slices shown (4 of 4)]
[im 1/34]
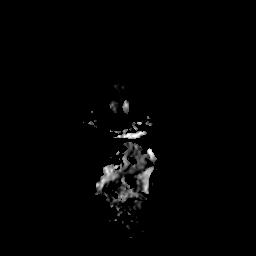
[im 34/34]
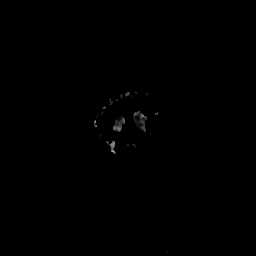

[23 of 48 positions shown; findings below may reference images not displayed]

FINDINGS: MRI HEAD FINDINGS

Brain: Mild age-related cerebral volume loss. No focal parenchymal
signal abnormality. No significant cerebral white matter disease for
age. No abnormal foci of restricted diffusion to suggest acute or
subacute ischemia. No encephalomalacia to suggest chronic
infarction. No solid artifact suggest acute or chronic intracranial
hemorrhage.

No mass lesion, midline shift or mass effect. No hydrocephalus. No
extra-axial fluid collection. Major dural sinuses patent. No
abnormal enhancement.

Pituitary gland suprasellar region normal. Midline structures intact
and normal.

Vascular:  Major intracranial vascular flow voids well maintained.

Skull base, calvarium, and soft tissues: Craniocervical junction
normal. Bone marrow signal intensity within normal limits. Scalp
soft tissues unremarkable.

Orbits/sinues: Globes and orbital soft tissues within normal limits.
Paranasal sinuses are clear. No mastoid effusion. Inner ear
structures normal.

MRI CERVICAL SPINE FINDINGS

Alignment: Vertebral bodies normally aligned with preservation of
the normal cervical lordosis. No listhesis.

Vertebrae: Vertebral body heights well maintained. No evidence for
acute or chronic fracture. Bone marrow signal intensity within
normal limits. No discrete or worrisome osseous lesions. No abnormal
marrow edema or enhancement.

Cord: Signal intensity within the cervical spinal cord is normal.

Posterior Fossa, vertebral arteries, paraspinal tissues: Paraspinous
soft tissues within normal limits. Normal intravascular flow voids
present within the vertebral arteries bilaterally.

Disc levels:

C2-C3: Unremarkable.

C3-C4:  Mild degenerative disc bulge.  No stenosis.

C4-C5: Shallow central disc protrusion mildly indents the ventral
thecal sac. No significant canal or foraminal stenosis.

C5-C6:  Unremarkable.

C6-C7:  Unremarkable.

C7-T1:  Unremarkable.

MRI THORACIC SPINE FINDINGS

Alignment: Vertebral bodies normally aligned with preservation of
the normal thoracic kyphosis. No listhesis.

Vertebrae: Vertebral body heights well maintained. No evidence for
acute or chronic fracture. Signal intensity within the vertebral
body bone marrow is normal. Few scattered benign hemangiomas noted,
most notable within the anterior aspect of the T4 vertebral body. No
worrisome osseous lesions. No abnormal marrow edema or enhancement.

Cord:  Signal intensity within the thoracic spinal cord is normal.

Paraspinal and other soft tissues: Paraspinous soft tissues within
normal limits. Partially visualized lungs are grossly clear.
Visualized visceral structures unremarkable.

Disc levels:

T6-7: Small right paracentral disc protrusion flattens the right
ventral thecal sac (series 24, image 20). Associated osseous
ridging. No significant stenosis.

T8-9: Left paracentral disc protrusion indents the left ventral
thecal sac (series 24, image 25). No significant stenosis.

No other significant degenerative changes within the thoracic spine.
No canal or foraminal stenosis.

MRI LUMBAR SPINE FINDINGS

Segmentation: Normal segmentation. Lowest well-formed disc labeled
the L5-S1 level.

Alignment: Trace retrolisthesis of L4 on L5. Trace anterolisthesis
of L5 on S1. Vertebral bodies otherwise normally aligned with
preservation of the normal lumbar lordosis.

Vertebrae: Vertebral body heights well maintained. No evidence for
acute or chronic fracture. Bone marrow signal intensity within
normal limits. Few scattered benign subcentimeter hemangiomas noted.
No worrisome osseous lesions. No abnormal marrow edema or
enhancement.

Conus medullaris: Extends to the L1 level and appears normal.

Paraspinal and other soft tissues: Paraspinous soft tissues within
normal limits. Tiny T2 hyperintense simple cysts noted within the
right kidney. Visualized visceral structures otherwise unremarkable.

Disc levels:

No significant degenerative changes seen through the L3-4 level.

L4-5: Trace retrolisthesis. Disc desiccation with shallow broad
posterior disc bulge. No stenosis or neural impingement.

L5-S1: Trace anterolisthesis. No significant disc bulge. No canal or
foraminal stenosis. No neural impingement.
IMPRESSION: MRI BRAIN IMPRESSION:

Normal MRI of the brain. No acute intracranial abnormality
identified.

MRI CERVICAL SPINE IMPRESSION:

1. Mild disc bulging at C3-4 and C4-5 without significant stenosis.
2. Otherwise normal MRI of the cervical spine.

MRI THORACIC SPINE IMPRESSION:

1. Small disc protrusions at T6-7 and T8-9 without significant
stenosis as above.
2. Otherwise normal MRI of the thoracic spine.

MRI LUMBAR SPINE IMPRESSION:

1. Mild degenerative disc bulging at L4-5 without stenosis or neural
impingement.
2. Otherwise unremarkable MRI of the lumbar spine.

## 2018-10-02 DIAGNOSIS — F3131 Bipolar disorder, current episode depressed, mild: Secondary | ICD-10-CM | POA: Diagnosis not present

## 2018-11-10 DIAGNOSIS — J0100 Acute maxillary sinusitis, unspecified: Secondary | ICD-10-CM | POA: Diagnosis not present

## 2018-12-16 DIAGNOSIS — F3174 Bipolar disorder, in full remission, most recent episode manic: Secondary | ICD-10-CM | POA: Diagnosis not present

## 2019-02-04 DIAGNOSIS — F3132 Bipolar disorder, current episode depressed, moderate: Secondary | ICD-10-CM | POA: Diagnosis not present

## 2019-02-23 ENCOUNTER — Ambulatory Visit: Payer: PPO | Admitting: Podiatry

## 2019-04-06 ENCOUNTER — Ambulatory Visit: Payer: PPO | Admitting: Podiatry

## 2019-06-26 DIAGNOSIS — F319 Bipolar disorder, unspecified: Secondary | ICD-10-CM | POA: Diagnosis not present

## 2019-06-26 DIAGNOSIS — E785 Hyperlipidemia, unspecified: Secondary | ICD-10-CM | POA: Diagnosis not present

## 2019-06-26 DIAGNOSIS — N183 Chronic kidney disease, stage 3 (moderate): Secondary | ICD-10-CM | POA: Diagnosis not present

## 2019-06-26 DIAGNOSIS — Z0000 Encounter for general adult medical examination without abnormal findings: Secondary | ICD-10-CM | POA: Diagnosis not present

## 2019-07-09 DIAGNOSIS — F3181 Bipolar II disorder: Secondary | ICD-10-CM | POA: Diagnosis not present

## 2019-07-22 DIAGNOSIS — F3181 Bipolar II disorder: Secondary | ICD-10-CM | POA: Diagnosis not present

## 2019-08-05 DIAGNOSIS — F3181 Bipolar II disorder: Secondary | ICD-10-CM | POA: Diagnosis not present

## 2019-08-14 DIAGNOSIS — F3131 Bipolar disorder, current episode depressed, mild: Secondary | ICD-10-CM | POA: Diagnosis not present

## 2019-08-14 DIAGNOSIS — F5001 Anorexia nervosa, restricting type: Secondary | ICD-10-CM | POA: Diagnosis not present

## 2019-08-19 DIAGNOSIS — E785 Hyperlipidemia, unspecified: Secondary | ICD-10-CM | POA: Diagnosis not present

## 2019-08-19 DIAGNOSIS — F3131 Bipolar disorder, current episode depressed, mild: Secondary | ICD-10-CM | POA: Diagnosis not present

## 2019-08-19 DIAGNOSIS — N183 Chronic kidney disease, stage 3 (moderate): Secondary | ICD-10-CM | POA: Diagnosis not present

## 2019-09-18 DIAGNOSIS — F5001 Anorexia nervosa, restricting type: Secondary | ICD-10-CM | POA: Diagnosis not present

## 2019-09-18 DIAGNOSIS — F3131 Bipolar disorder, current episode depressed, mild: Secondary | ICD-10-CM | POA: Diagnosis not present

## 2019-10-01 DIAGNOSIS — R509 Fever, unspecified: Secondary | ICD-10-CM | POA: Diagnosis not present

## 2019-10-01 DIAGNOSIS — R05 Cough: Secondary | ICD-10-CM | POA: Diagnosis not present

## 2019-10-01 DIAGNOSIS — Z20828 Contact with and (suspected) exposure to other viral communicable diseases: Secondary | ICD-10-CM | POA: Diagnosis not present

## 2019-10-01 DIAGNOSIS — R6883 Chills (without fever): Secondary | ICD-10-CM | POA: Diagnosis not present

## 2019-10-08 DIAGNOSIS — F5001 Anorexia nervosa, restricting type: Secondary | ICD-10-CM | POA: Diagnosis not present

## 2019-10-08 DIAGNOSIS — F3131 Bipolar disorder, current episode depressed, mild: Secondary | ICD-10-CM | POA: Diagnosis not present

## 2019-10-29 DIAGNOSIS — F3131 Bipolar disorder, current episode depressed, mild: Secondary | ICD-10-CM | POA: Diagnosis not present

## 2019-11-23 DIAGNOSIS — F3131 Bipolar disorder, current episode depressed, mild: Secondary | ICD-10-CM | POA: Diagnosis not present

## 2019-11-23 DIAGNOSIS — F5001 Anorexia nervosa, restricting type: Secondary | ICD-10-CM | POA: Diagnosis not present

## 2019-11-26 DIAGNOSIS — N183 Chronic kidney disease, stage 3 unspecified: Secondary | ICD-10-CM | POA: Diagnosis not present

## 2019-11-26 DIAGNOSIS — E785 Hyperlipidemia, unspecified: Secondary | ICD-10-CM | POA: Diagnosis not present

## 2019-12-07 DIAGNOSIS — F5001 Anorexia nervosa, restricting type: Secondary | ICD-10-CM | POA: Diagnosis not present

## 2019-12-07 DIAGNOSIS — Z79891 Long term (current) use of opiate analgesic: Secondary | ICD-10-CM | POA: Diagnosis not present

## 2019-12-07 DIAGNOSIS — F419 Anxiety disorder, unspecified: Secondary | ICD-10-CM | POA: Diagnosis not present

## 2019-12-07 DIAGNOSIS — F3131 Bipolar disorder, current episode depressed, mild: Secondary | ICD-10-CM | POA: Diagnosis not present

## 2019-12-23 DIAGNOSIS — F3131 Bipolar disorder, current episode depressed, mild: Secondary | ICD-10-CM | POA: Diagnosis not present

## 2019-12-23 DIAGNOSIS — F419 Anxiety disorder, unspecified: Secondary | ICD-10-CM | POA: Diagnosis not present

## 2019-12-24 DIAGNOSIS — N183 Chronic kidney disease, stage 3 unspecified: Secondary | ICD-10-CM | POA: Diagnosis not present

## 2019-12-24 DIAGNOSIS — E785 Hyperlipidemia, unspecified: Secondary | ICD-10-CM | POA: Diagnosis not present

## 2020-01-06 DIAGNOSIS — N183 Chronic kidney disease, stage 3 unspecified: Secondary | ICD-10-CM | POA: Diagnosis not present

## 2020-01-06 DIAGNOSIS — E785 Hyperlipidemia, unspecified: Secondary | ICD-10-CM | POA: Diagnosis not present

## 2020-01-13 DIAGNOSIS — F5001 Anorexia nervosa, restricting type: Secondary | ICD-10-CM | POA: Diagnosis not present

## 2020-01-13 DIAGNOSIS — F3131 Bipolar disorder, current episode depressed, mild: Secondary | ICD-10-CM | POA: Diagnosis not present

## 2020-01-13 DIAGNOSIS — F419 Anxiety disorder, unspecified: Secondary | ICD-10-CM | POA: Diagnosis not present

## 2020-02-04 DIAGNOSIS — E785 Hyperlipidemia, unspecified: Secondary | ICD-10-CM | POA: Diagnosis not present

## 2020-02-04 DIAGNOSIS — N183 Chronic kidney disease, stage 3 unspecified: Secondary | ICD-10-CM | POA: Diagnosis not present

## 2020-02-05 DIAGNOSIS — Z1339 Encounter for screening examination for other mental health and behavioral disorders: Secondary | ICD-10-CM | POA: Diagnosis not present

## 2020-02-23 DIAGNOSIS — R42 Dizziness and giddiness: Secondary | ICD-10-CM | POA: Diagnosis not present

## 2020-02-23 DIAGNOSIS — F419 Anxiety disorder, unspecified: Secondary | ICD-10-CM | POA: Diagnosis not present

## 2020-02-23 DIAGNOSIS — F064 Anxiety disorder due to known physiological condition: Secondary | ICD-10-CM | POA: Diagnosis not present

## 2020-02-23 DIAGNOSIS — N946 Dysmenorrhea, unspecified: Secondary | ICD-10-CM | POA: Diagnosis not present

## 2020-02-23 DIAGNOSIS — R531 Weakness: Secondary | ICD-10-CM | POA: Diagnosis not present

## 2020-02-24 DIAGNOSIS — I071 Rheumatic tricuspid insufficiency: Secondary | ICD-10-CM | POA: Diagnosis not present

## 2020-02-24 DIAGNOSIS — I10 Essential (primary) hypertension: Secondary | ICD-10-CM | POA: Diagnosis not present

## 2020-02-24 DIAGNOSIS — I340 Nonrheumatic mitral (valve) insufficiency: Secondary | ICD-10-CM | POA: Diagnosis not present

## 2020-02-24 DIAGNOSIS — R5383 Other fatigue: Secondary | ICD-10-CM | POA: Diagnosis not present

## 2020-03-07 DIAGNOSIS — E785 Hyperlipidemia, unspecified: Secondary | ICD-10-CM | POA: Diagnosis not present

## 2020-03-07 DIAGNOSIS — F419 Anxiety disorder, unspecified: Secondary | ICD-10-CM | POA: Diagnosis not present

## 2020-03-07 DIAGNOSIS — N183 Chronic kidney disease, stage 3 unspecified: Secondary | ICD-10-CM | POA: Diagnosis not present

## 2020-03-07 DIAGNOSIS — N946 Dysmenorrhea, unspecified: Secondary | ICD-10-CM | POA: Diagnosis not present

## 2020-03-07 DIAGNOSIS — F319 Bipolar disorder, unspecified: Secondary | ICD-10-CM | POA: Diagnosis not present

## 2020-03-07 DIAGNOSIS — Z681 Body mass index (BMI) 19 or less, adult: Secondary | ICD-10-CM | POA: Diagnosis not present

## 2020-03-24 ENCOUNTER — Other Ambulatory Visit: Payer: Self-pay

## 2020-03-24 ENCOUNTER — Ambulatory Visit (HOSPITAL_COMMUNITY): Payer: PPO | Admitting: Psychiatry

## 2020-03-30 DIAGNOSIS — F064 Anxiety disorder due to known physiological condition: Secondary | ICD-10-CM | POA: Diagnosis not present

## 2020-04-06 DIAGNOSIS — F411 Generalized anxiety disorder: Secondary | ICD-10-CM | POA: Diagnosis not present

## 2020-04-15 DIAGNOSIS — F064 Anxiety disorder due to known physiological condition: Secondary | ICD-10-CM | POA: Diagnosis not present

## 2020-05-05 DIAGNOSIS — F3181 Bipolar II disorder: Secondary | ICD-10-CM | POA: Diagnosis not present

## 2020-05-24 DIAGNOSIS — F319 Bipolar disorder, unspecified: Secondary | ICD-10-CM | POA: Diagnosis not present

## 2020-05-24 DIAGNOSIS — N946 Dysmenorrhea, unspecified: Secondary | ICD-10-CM | POA: Diagnosis not present

## 2020-06-01 DIAGNOSIS — G44009 Cluster headache syndrome, unspecified, not intractable: Secondary | ICD-10-CM | POA: Diagnosis not present

## 2020-06-01 DIAGNOSIS — F3181 Bipolar II disorder: Secondary | ICD-10-CM | POA: Diagnosis not present

## 2020-07-07 DIAGNOSIS — F3181 Bipolar II disorder: Secondary | ICD-10-CM | POA: Diagnosis not present

## 2020-07-07 DIAGNOSIS — F509 Eating disorder, unspecified: Secondary | ICD-10-CM | POA: Diagnosis not present

## 2020-07-07 DIAGNOSIS — F5101 Primary insomnia: Secondary | ICD-10-CM | POA: Diagnosis not present

## 2020-08-02 DIAGNOSIS — F3181 Bipolar II disorder: Secondary | ICD-10-CM | POA: Diagnosis not present

## 2020-08-24 DIAGNOSIS — F3181 Bipolar II disorder: Secondary | ICD-10-CM | POA: Diagnosis not present

## 2020-09-07 ENCOUNTER — Other Ambulatory Visit: Payer: Self-pay | Admitting: Family Medicine

## 2020-09-07 DIAGNOSIS — Z0000 Encounter for general adult medical examination without abnormal findings: Secondary | ICD-10-CM

## 2020-09-14 DIAGNOSIS — F411 Generalized anxiety disorder: Secondary | ICD-10-CM | POA: Diagnosis not present

## 2020-09-14 DIAGNOSIS — F3181 Bipolar II disorder: Secondary | ICD-10-CM | POA: Diagnosis not present

## 2020-09-30 ENCOUNTER — Ambulatory Visit: Payer: PPO

## 2020-11-03 DIAGNOSIS — Z0000 Encounter for general adult medical examination without abnormal findings: Secondary | ICD-10-CM | POA: Diagnosis not present

## 2020-11-03 DIAGNOSIS — Z23 Encounter for immunization: Secondary | ICD-10-CM | POA: Diagnosis not present

## 2020-11-03 DIAGNOSIS — E782 Mixed hyperlipidemia: Secondary | ICD-10-CM | POA: Diagnosis not present

## 2020-11-03 DIAGNOSIS — N183 Chronic kidney disease, stage 3 unspecified: Secondary | ICD-10-CM | POA: Diagnosis not present

## 2020-11-03 DIAGNOSIS — R635 Abnormal weight gain: Secondary | ICD-10-CM | POA: Diagnosis not present

## 2020-11-30 DIAGNOSIS — F3181 Bipolar II disorder: Secondary | ICD-10-CM | POA: Diagnosis not present

## 2020-12-20 DIAGNOSIS — M654 Radial styloid tenosynovitis [de Quervain]: Secondary | ICD-10-CM | POA: Diagnosis not present

## 2020-12-29 DIAGNOSIS — E782 Mixed hyperlipidemia: Secondary | ICD-10-CM | POA: Diagnosis not present

## 2020-12-29 DIAGNOSIS — N183 Chronic kidney disease, stage 3 unspecified: Secondary | ICD-10-CM | POA: Diagnosis not present

## 2020-12-29 DIAGNOSIS — G4700 Insomnia, unspecified: Secondary | ICD-10-CM | POA: Diagnosis not present

## 2020-12-29 DIAGNOSIS — F319 Bipolar disorder, unspecified: Secondary | ICD-10-CM | POA: Diagnosis not present

## 2021-01-11 DIAGNOSIS — F310 Bipolar disorder, current episode hypomanic: Secondary | ICD-10-CM | POA: Diagnosis not present

## 2021-01-23 DIAGNOSIS — L728 Other follicular cysts of the skin and subcutaneous tissue: Secondary | ICD-10-CM | POA: Diagnosis not present

## 2021-01-23 DIAGNOSIS — L578 Other skin changes due to chronic exposure to nonionizing radiation: Secondary | ICD-10-CM | POA: Diagnosis not present

## 2021-01-23 DIAGNOSIS — L821 Other seborrheic keratosis: Secondary | ICD-10-CM | POA: Diagnosis not present

## 2021-01-25 DIAGNOSIS — F319 Bipolar disorder, unspecified: Secondary | ICD-10-CM | POA: Diagnosis not present

## 2021-01-25 DIAGNOSIS — E782 Mixed hyperlipidemia: Secondary | ICD-10-CM | POA: Diagnosis not present

## 2021-01-25 DIAGNOSIS — G4700 Insomnia, unspecified: Secondary | ICD-10-CM | POA: Diagnosis not present

## 2021-01-25 DIAGNOSIS — E785 Hyperlipidemia, unspecified: Secondary | ICD-10-CM | POA: Diagnosis not present

## 2021-01-25 DIAGNOSIS — N183 Chronic kidney disease, stage 3 unspecified: Secondary | ICD-10-CM | POA: Diagnosis not present

## 2021-02-27 DIAGNOSIS — F319 Bipolar disorder, unspecified: Secondary | ICD-10-CM | POA: Diagnosis not present

## 2021-02-27 DIAGNOSIS — E782 Mixed hyperlipidemia: Secondary | ICD-10-CM | POA: Diagnosis not present

## 2021-02-27 DIAGNOSIS — G4700 Insomnia, unspecified: Secondary | ICD-10-CM | POA: Diagnosis not present

## 2021-02-27 DIAGNOSIS — N183 Chronic kidney disease, stage 3 unspecified: Secondary | ICD-10-CM | POA: Diagnosis not present

## 2021-02-27 DIAGNOSIS — E785 Hyperlipidemia, unspecified: Secondary | ICD-10-CM | POA: Diagnosis not present

## 2021-03-22 DIAGNOSIS — F411 Generalized anxiety disorder: Secondary | ICD-10-CM | POA: Diagnosis not present

## 2021-03-22 DIAGNOSIS — F3181 Bipolar II disorder: Secondary | ICD-10-CM | POA: Diagnosis not present

## 2021-03-22 DIAGNOSIS — G44001 Cluster headache syndrome, unspecified, intractable: Secondary | ICD-10-CM | POA: Diagnosis not present

## 2021-03-27 DIAGNOSIS — N183 Chronic kidney disease, stage 3 unspecified: Secondary | ICD-10-CM | POA: Diagnosis not present

## 2021-03-27 DIAGNOSIS — E782 Mixed hyperlipidemia: Secondary | ICD-10-CM | POA: Diagnosis not present

## 2021-03-27 DIAGNOSIS — E785 Hyperlipidemia, unspecified: Secondary | ICD-10-CM | POA: Diagnosis not present

## 2021-03-27 DIAGNOSIS — F319 Bipolar disorder, unspecified: Secondary | ICD-10-CM | POA: Diagnosis not present

## 2021-03-27 DIAGNOSIS — G4700 Insomnia, unspecified: Secondary | ICD-10-CM | POA: Diagnosis not present

## 2021-04-26 DIAGNOSIS — E782 Mixed hyperlipidemia: Secondary | ICD-10-CM | POA: Diagnosis not present

## 2021-04-26 DIAGNOSIS — N183 Chronic kidney disease, stage 3 unspecified: Secondary | ICD-10-CM | POA: Diagnosis not present

## 2021-04-26 DIAGNOSIS — E785 Hyperlipidemia, unspecified: Secondary | ICD-10-CM | POA: Diagnosis not present

## 2021-04-26 DIAGNOSIS — F319 Bipolar disorder, unspecified: Secondary | ICD-10-CM | POA: Diagnosis not present

## 2021-04-26 DIAGNOSIS — G4700 Insomnia, unspecified: Secondary | ICD-10-CM | POA: Diagnosis not present

## 2021-06-07 DIAGNOSIS — F410 Panic disorder [episodic paroxysmal anxiety] without agoraphobia: Secondary | ICD-10-CM | POA: Diagnosis not present

## 2021-06-07 DIAGNOSIS — F3181 Bipolar II disorder: Secondary | ICD-10-CM | POA: Diagnosis not present

## 2021-07-21 DIAGNOSIS — E782 Mixed hyperlipidemia: Secondary | ICD-10-CM | POA: Diagnosis not present

## 2021-07-21 DIAGNOSIS — E785 Hyperlipidemia, unspecified: Secondary | ICD-10-CM | POA: Diagnosis not present

## 2021-07-21 DIAGNOSIS — G4700 Insomnia, unspecified: Secondary | ICD-10-CM | POA: Diagnosis not present

## 2021-07-21 DIAGNOSIS — F319 Bipolar disorder, unspecified: Secondary | ICD-10-CM | POA: Diagnosis not present

## 2021-07-21 DIAGNOSIS — N183 Chronic kidney disease, stage 3 unspecified: Secondary | ICD-10-CM | POA: Diagnosis not present

## 2021-07-24 DIAGNOSIS — Z1231 Encounter for screening mammogram for malignant neoplasm of breast: Secondary | ICD-10-CM | POA: Diagnosis not present

## 2021-09-06 DIAGNOSIS — F4001 Agoraphobia with panic disorder: Secondary | ICD-10-CM | POA: Diagnosis not present

## 2021-09-06 DIAGNOSIS — F3181 Bipolar II disorder: Secondary | ICD-10-CM | POA: Diagnosis not present

## 2021-10-30 DIAGNOSIS — Z1212 Encounter for screening for malignant neoplasm of rectum: Secondary | ICD-10-CM | POA: Diagnosis not present

## 2021-12-06 DIAGNOSIS — F3181 Bipolar II disorder: Secondary | ICD-10-CM | POA: Diagnosis not present

## 2021-12-06 DIAGNOSIS — F4001 Agoraphobia with panic disorder: Secondary | ICD-10-CM | POA: Diagnosis not present

## 2021-12-08 DIAGNOSIS — F319 Bipolar disorder, unspecified: Secondary | ICD-10-CM | POA: Diagnosis not present

## 2021-12-08 DIAGNOSIS — E782 Mixed hyperlipidemia: Secondary | ICD-10-CM | POA: Diagnosis not present

## 2022-01-16 DIAGNOSIS — F319 Bipolar disorder, unspecified: Secondary | ICD-10-CM | POA: Diagnosis not present

## 2022-01-16 DIAGNOSIS — E782 Mixed hyperlipidemia: Secondary | ICD-10-CM | POA: Diagnosis not present

## 2022-01-18 DIAGNOSIS — R0981 Nasal congestion: Secondary | ICD-10-CM | POA: Diagnosis not present

## 2022-01-18 DIAGNOSIS — R059 Cough, unspecified: Secondary | ICD-10-CM | POA: Diagnosis not present

## 2022-01-18 DIAGNOSIS — Z20828 Contact with and (suspected) exposure to other viral communicable diseases: Secondary | ICD-10-CM | POA: Diagnosis not present

## 2022-01-18 DIAGNOSIS — R1084 Generalized abdominal pain: Secondary | ICD-10-CM | POA: Diagnosis not present

## 2022-02-14 DIAGNOSIS — F314 Bipolar disorder, current episode depressed, severe, without psychotic features: Secondary | ICD-10-CM | POA: Diagnosis not present

## 2022-03-21 DIAGNOSIS — F3131 Bipolar disorder, current episode depressed, mild: Secondary | ICD-10-CM | POA: Diagnosis not present

## 2022-03-23 DIAGNOSIS — Z124 Encounter for screening for malignant neoplasm of cervix: Secondary | ICD-10-CM | POA: Diagnosis not present

## 2022-04-13 DIAGNOSIS — N183 Chronic kidney disease, stage 3 unspecified: Secondary | ICD-10-CM | POA: Diagnosis not present

## 2022-04-13 DIAGNOSIS — E785 Hyperlipidemia, unspecified: Secondary | ICD-10-CM | POA: Diagnosis not present

## 2022-04-13 DIAGNOSIS — F319 Bipolar disorder, unspecified: Secondary | ICD-10-CM | POA: Diagnosis not present

## 2022-06-13 DIAGNOSIS — F3176 Bipolar disorder, in full remission, most recent episode depressed: Secondary | ICD-10-CM | POA: Diagnosis not present

## 2022-06-22 DIAGNOSIS — H04123 Dry eye syndrome of bilateral lacrimal glands: Secondary | ICD-10-CM | POA: Diagnosis not present

## 2022-06-24 DIAGNOSIS — R110 Nausea: Secondary | ICD-10-CM | POA: Diagnosis not present

## 2022-06-24 DIAGNOSIS — H81399 Other peripheral vertigo, unspecified ear: Secondary | ICD-10-CM | POA: Diagnosis not present

## 2022-06-24 DIAGNOSIS — J019 Acute sinusitis, unspecified: Secondary | ICD-10-CM | POA: Diagnosis not present

## 2022-06-26 DIAGNOSIS — R42 Dizziness and giddiness: Secondary | ICD-10-CM | POA: Diagnosis not present

## 2022-06-26 DIAGNOSIS — S0990XA Unspecified injury of head, initial encounter: Secondary | ICD-10-CM | POA: Diagnosis not present

## 2022-06-27 ENCOUNTER — Other Ambulatory Visit: Payer: Self-pay | Admitting: Physician Assistant

## 2022-06-27 DIAGNOSIS — S0990XA Unspecified injury of head, initial encounter: Secondary | ICD-10-CM

## 2022-07-01 ENCOUNTER — Ambulatory Visit
Admission: RE | Admit: 2022-07-01 | Discharge: 2022-07-01 | Disposition: A | Discharge status: Home / Self Care | Payer: PPO | Source: Ambulatory Visit | Attending: Physician Assistant | Admitting: Physician Assistant

## 2022-07-01 DIAGNOSIS — R42 Dizziness and giddiness: Secondary | ICD-10-CM | POA: Diagnosis not present

## 2022-07-01 DIAGNOSIS — S0990XA Unspecified injury of head, initial encounter: Secondary | ICD-10-CM

## 2022-07-16 DIAGNOSIS — E782 Mixed hyperlipidemia: Secondary | ICD-10-CM | POA: Diagnosis not present

## 2022-07-16 DIAGNOSIS — F319 Bipolar disorder, unspecified: Secondary | ICD-10-CM | POA: Diagnosis not present

## 2022-07-16 DIAGNOSIS — N183 Chronic kidney disease, stage 3 unspecified: Secondary | ICD-10-CM | POA: Diagnosis not present

## 2022-09-08 DIAGNOSIS — F3181 Bipolar II disorder: Secondary | ICD-10-CM | POA: Diagnosis not present

## 2022-09-08 DIAGNOSIS — F411 Generalized anxiety disorder: Secondary | ICD-10-CM | POA: Diagnosis not present

## 2022-11-07 DIAGNOSIS — H903 Sensorineural hearing loss, bilateral: Secondary | ICD-10-CM | POA: Diagnosis not present

## 2022-11-07 DIAGNOSIS — R42 Dizziness and giddiness: Secondary | ICD-10-CM | POA: Diagnosis not present

## 2022-11-23 DIAGNOSIS — R42 Dizziness and giddiness: Secondary | ICD-10-CM | POA: Diagnosis not present

## 2022-12-01 DIAGNOSIS — F410 Panic disorder [episodic paroxysmal anxiety] without agoraphobia: Secondary | ICD-10-CM | POA: Diagnosis not present

## 2022-12-01 DIAGNOSIS — F3181 Bipolar II disorder: Secondary | ICD-10-CM | POA: Diagnosis not present

## 2022-12-27 DIAGNOSIS — R42 Dizziness and giddiness: Secondary | ICD-10-CM | POA: Diagnosis not present

## 2022-12-27 DIAGNOSIS — H903 Sensorineural hearing loss, bilateral: Secondary | ICD-10-CM | POA: Diagnosis not present

## 2023-01-07 ENCOUNTER — Ambulatory Visit: Payer: PPO | Admitting: Physical Therapy

## 2023-01-15 ENCOUNTER — Ambulatory Visit: Payer: PPO | Admitting: Physical Therapy

## 2023-01-22 ENCOUNTER — Ambulatory Visit: Payer: PPO | Attending: Otolaryngology | Admitting: Physical Therapy

## 2023-03-20 DIAGNOSIS — F3181 Bipolar II disorder: Secondary | ICD-10-CM | POA: Diagnosis not present

## 2023-03-20 DIAGNOSIS — F4001 Agoraphobia with panic disorder: Secondary | ICD-10-CM | POA: Diagnosis not present

## 2023-04-06 DIAGNOSIS — F3181 Bipolar II disorder: Secondary | ICD-10-CM | POA: Diagnosis not present

## 2023-05-01 DIAGNOSIS — F3181 Bipolar II disorder: Secondary | ICD-10-CM | POA: Diagnosis not present

## 2023-06-04 DIAGNOSIS — E785 Hyperlipidemia, unspecified: Secondary | ICD-10-CM | POA: Diagnosis not present

## 2023-06-04 DIAGNOSIS — Z0000 Encounter for general adult medical examination without abnormal findings: Secondary | ICD-10-CM | POA: Diagnosis not present

## 2023-06-04 DIAGNOSIS — N1831 Chronic kidney disease, stage 3a: Secondary | ICD-10-CM | POA: Diagnosis not present

## 2023-06-04 DIAGNOSIS — E46 Unspecified protein-calorie malnutrition: Secondary | ICD-10-CM | POA: Diagnosis not present

## 2023-06-04 DIAGNOSIS — F319 Bipolar disorder, unspecified: Secondary | ICD-10-CM | POA: Diagnosis not present

## 2023-06-04 DIAGNOSIS — F5000 Anorexia nervosa, unspecified: Secondary | ICD-10-CM | POA: Diagnosis not present

## 2023-06-04 DIAGNOSIS — Z23 Encounter for immunization: Secondary | ICD-10-CM | POA: Diagnosis not present

## 2023-06-04 DIAGNOSIS — Z1211 Encounter for screening for malignant neoplasm of colon: Secondary | ICD-10-CM | POA: Diagnosis not present

## 2023-06-05 DIAGNOSIS — F3181 Bipolar II disorder: Secondary | ICD-10-CM | POA: Diagnosis not present

## 2023-07-10 DIAGNOSIS — F3181 Bipolar II disorder: Secondary | ICD-10-CM | POA: Diagnosis not present

## 2023-09-20 DIAGNOSIS — R635 Abnormal weight gain: Secondary | ICD-10-CM | POA: Diagnosis not present

## 2023-09-20 DIAGNOSIS — F319 Bipolar disorder, unspecified: Secondary | ICD-10-CM | POA: Diagnosis not present

## 2023-09-20 DIAGNOSIS — D649 Anemia, unspecified: Secondary | ICD-10-CM | POA: Diagnosis not present

## 2023-09-20 DIAGNOSIS — Z23 Encounter for immunization: Secondary | ICD-10-CM | POA: Diagnosis not present

## 2023-09-25 DIAGNOSIS — F310 Bipolar disorder, current episode hypomanic: Secondary | ICD-10-CM | POA: Diagnosis not present

## 2023-10-11 DIAGNOSIS — T1591XA Foreign body on external eye, part unspecified, right eye, initial encounter: Secondary | ICD-10-CM | POA: Diagnosis not present

## 2023-10-11 DIAGNOSIS — H5711 Ocular pain, right eye: Secondary | ICD-10-CM | POA: Diagnosis not present

## 2023-11-06 DIAGNOSIS — F3181 Bipolar II disorder: Secondary | ICD-10-CM | POA: Diagnosis not present

## 2023-11-06 DIAGNOSIS — F410 Panic disorder [episodic paroxysmal anxiety] without agoraphobia: Secondary | ICD-10-CM | POA: Diagnosis not present

## 2024-03-18 DIAGNOSIS — F3181 Bipolar II disorder: Secondary | ICD-10-CM | POA: Diagnosis not present

## 2024-03-20 DIAGNOSIS — R92333 Mammographic heterogeneous density, bilateral breasts: Secondary | ICD-10-CM | POA: Diagnosis not present

## 2024-03-20 DIAGNOSIS — N644 Mastodynia: Secondary | ICD-10-CM | POA: Diagnosis not present

## 2024-05-13 DIAGNOSIS — F310 Bipolar disorder, current episode hypomanic: Secondary | ICD-10-CM | POA: Diagnosis not present

## 2024-06-08 DIAGNOSIS — R0981 Nasal congestion: Secondary | ICD-10-CM | POA: Diagnosis not present

## 2024-06-08 DIAGNOSIS — F319 Bipolar disorder, unspecified: Secondary | ICD-10-CM | POA: Diagnosis not present

## 2024-06-08 DIAGNOSIS — Z1211 Encounter for screening for malignant neoplasm of colon: Secondary | ICD-10-CM | POA: Diagnosis not present

## 2024-06-08 DIAGNOSIS — F5000 Anorexia nervosa, unspecified: Secondary | ICD-10-CM | POA: Diagnosis not present

## 2024-06-08 DIAGNOSIS — N1831 Chronic kidney disease, stage 3a: Secondary | ICD-10-CM | POA: Diagnosis not present

## 2024-06-08 DIAGNOSIS — E785 Hyperlipidemia, unspecified: Secondary | ICD-10-CM | POA: Diagnosis not present

## 2024-06-08 DIAGNOSIS — Z0000 Encounter for general adult medical examination without abnormal findings: Secondary | ICD-10-CM | POA: Diagnosis not present

## 2024-06-21 DIAGNOSIS — Z1211 Encounter for screening for malignant neoplasm of colon: Secondary | ICD-10-CM | POA: Diagnosis not present

## 2024-06-21 DIAGNOSIS — Z1212 Encounter for screening for malignant neoplasm of rectum: Secondary | ICD-10-CM | POA: Diagnosis not present

## 2024-08-12 DIAGNOSIS — F3181 Bipolar II disorder: Secondary | ICD-10-CM | POA: Diagnosis not present

## 2024-11-04 DIAGNOSIS — F3181 Bipolar II disorder: Secondary | ICD-10-CM | POA: Diagnosis not present
